# Patient Record
Sex: Female | Born: 1975
Health system: Southern US, Community
[De-identification: ages and names within clinical notes are randomized; demographics above are authoritative.]

## PROBLEM LIST (undated history)

## (undated) DIAGNOSIS — D649 Anemia, unspecified: Secondary | ICD-10-CM

## (undated) DIAGNOSIS — R51 Headache: Secondary | ICD-10-CM

## (undated) DIAGNOSIS — Z800 Family history of malignant neoplasm of digestive organs: Secondary | ICD-10-CM

## (undated) DIAGNOSIS — Z803 Family history of malignant neoplasm of breast: Secondary | ICD-10-CM

## (undated) HISTORY — DX: Family history of malignant neoplasm of breast: Z80.3

## (undated) HISTORY — DX: Family history of malignant neoplasm of digestive organs: Z80.0

---

## 2013-03-24 ENCOUNTER — Other Ambulatory Visit: Payer: Self-pay | Admitting: Family Medicine

## 2013-03-24 DIAGNOSIS — R1011 Right upper quadrant pain: Secondary | ICD-10-CM

## 2013-03-28 ENCOUNTER — Ambulatory Visit
Admission: RE | Admit: 2013-03-28 | Discharge: 2013-03-28 | Disposition: A | Discharge status: Home / Self Care | Payer: BC Managed Care – PPO | Source: Ambulatory Visit | Attending: Family Medicine | Admitting: Family Medicine

## 2013-03-28 DIAGNOSIS — R1011 Right upper quadrant pain: Secondary | ICD-10-CM

## 2013-03-31 ENCOUNTER — Other Ambulatory Visit (HOSPITAL_COMMUNITY): Payer: Self-pay | Admitting: Family Medicine

## 2013-03-31 DIAGNOSIS — R1011 Right upper quadrant pain: Secondary | ICD-10-CM

## 2013-04-12 ENCOUNTER — Ambulatory Visit (HOSPITAL_COMMUNITY)
Admission: RE | Admit: 2013-04-12 | Discharge: 2013-04-12 | Disposition: A | Discharge status: Home / Self Care | Payer: BC Managed Care – PPO | Source: Ambulatory Visit | Attending: Family Medicine | Admitting: Family Medicine

## 2013-04-12 DIAGNOSIS — R1011 Right upper quadrant pain: Secondary | ICD-10-CM

## 2013-04-12 DIAGNOSIS — R109 Unspecified abdominal pain: Secondary | ICD-10-CM | POA: Insufficient documentation

## 2013-04-12 MED ORDER — TECHNETIUM TC 99M MEBROFENIN IV KIT
5.2000 | PACK | Freq: Once | INTRAVENOUS | Status: AC | PRN
  Administered 2013-04-12: 5 via INTRAVENOUS

## 2013-04-12 MED ORDER — SINCALIDE 5 MCG IJ SOLR: 0.0200 ug/kg | Freq: Once | INTRAMUSCULAR | Status: DC

## 2013-04-20 ENCOUNTER — Encounter (INDEPENDENT_AMBULATORY_CARE_PROVIDER_SITE_OTHER): Payer: Self-pay | Admitting: Surgery

## 2013-04-26 ENCOUNTER — Encounter (INDEPENDENT_AMBULATORY_CARE_PROVIDER_SITE_OTHER): Payer: Self-pay | Admitting: Surgery

## 2013-04-26 ENCOUNTER — Ambulatory Visit (INDEPENDENT_AMBULATORY_CARE_PROVIDER_SITE_OTHER): Payer: BC Managed Care – PPO | Admitting: Surgery

## 2013-04-26 ENCOUNTER — Encounter (HOSPITAL_COMMUNITY): Payer: Self-pay | Admitting: Pharmacy Technician

## 2013-04-26 DIAGNOSIS — K811 Chronic cholecystitis: Secondary | ICD-10-CM | POA: Insufficient documentation

## 2013-04-26 NOTE — Progress Notes (Signed)
Patient ID: Misty Strickland, female   DOB: 05/17/1975, 38 y.o.   MRN: [id number]  Chief Complaint  Patient presents with  . New Evaluation    Biliary Dyskinesia    HPI Misty Strickland is a 38 y.o. female.  Referred by Dr. Kathyrn Lass for evaluation of biliary dyskinesia  HPI This is a healthy 38 year old female who is status post 3 cesarean sections. During each of her pregnancies she had intermittent right upper quadrant pain with radiation through to her back. Ultrasound is were performed that showed no sign of gallstones. Over the last 3 months she has developed significant postprandial generalized abdominal pain, bloating, nausea, and diarrhea. These episodes last for 2-3 hours. She had another ultrasound which showed no sign of gallstones or gallbladder wall thickening. A HIDA scan showed a diminished gallbladder ejection fraction at 25.8%. After the scan was complete, she did have a recurrence of her symptoms.    History reviewed. No pertinent past medical history.  Past Surgical History  Procedure Laterality Date  . Cesarean section      2007,2009,2013    Family History  Problem Relation Age of Onset  . Cancer Maternal Grandmother     colon  . Cancer Paternal Grandfather     thyroid cancer    Social History History  Substance Use Topics  . Smoking status: Never Smoker   . Smokeless tobacco: Not on file  . Alcohol Use: 0.0 oz/week    2-3 Glasses of wine per week     Comment: 2-3 a week    No Known Allergies  No current outpatient prescriptions on file.   No current facility-administered medications for this visit.    Review of Systems Review of Systems  Constitutional: Negative for fever, chills and unexpected weight change.  HENT: Negative for congestion, hearing loss, sore throat, trouble swallowing and voice change.   Eyes: Negative for visual disturbance.  Respiratory: Negative for cough and wheezing.   Cardiovascular: Negative for chest pain, palpitations  and leg swelling.  Gastrointestinal: Positive for nausea, abdominal pain, diarrhea and abdominal distention. Negative for vomiting, constipation, blood in stool and anal bleeding.  Genitourinary: Negative for hematuria, vaginal bleeding and difficulty urinating.  Musculoskeletal: Negative for arthralgias.  Skin: Negative for rash and wound.  Neurological: Negative for seizures, syncope and headaches.  Hematological: Negative for adenopathy. Does not bruise/bleed easily.  Psychiatric/Behavioral: Negative for confusion.    Last menstrual period 03/15/2013.  Physical Exam Physical Exam WDWN in NAD HEENT:  EOMI, sclera anicteric Neck:  No masses, no thyromegaly Lungs:  CTA bilaterally; normal respiratory effort CV:  Regular rate and rhythm; no murmurs Abd:  +bowel sounds, soft, non-tender, no masses Ext:  Well-perfused; no edema Skin:  Warm, dry; no sign of jaundice  Data Reviewed Nm Hepato W/eject Fract  04/12/2013   CLINICAL DATA:  Upper abdominal pain  EXAM: NUCLEAR MEDICINE HEPATOBILIARY IMAGING WITH GALLBLADDER EF  Views: Anterior right upper quadrant  Radionuclide:  Technetium 67m  Choletec  Dose:  5.2 mCi  Route of administration: Intravenous  COMPARISON:  None.  FINDINGS: Liver uptake of radiotracer is normal. There is prompt visualization of gallbladder and small bowel, indicating patency of the cystic and common bile ducts.  A weight based dose, 1.2 mcg, of CCK was administered with calculation of the computer generated ejection fraction of radiotracer from the gallbladder. The patient did not experience symptoms with CCK administration. The ejection fraction of radiotracer from the gallbladder is diminished at 25.8%, normal greater than 38%.  IMPRESSION: Abnormally low ejection fraction of radiotracer from the gallbladder, a finding concerning for biliary dyskinesia. Cystic and common bile ducts are patent as is evidenced by visualization of gallbladder and small bowel.    Electronically Signed   By: Lowella Grip M.D.   On: 04/12/2013 15:16   US Abdomen Limited  03/28/2013   CLINICAL DATA:  Upper abdominal pain  EXAM: US ABDOMEN LIMITED - RIGHT UPPER QUADRANT  COMPARISON:  None.  FINDINGS: Gallbladder:  No gallstones or wall thickening visualized. There is no pericholecystic fluid. No sonographic Murphy sign noted.  Common bile duct:  Diameter: 2 mm. There is no intrahepatic or extrahepatic biliary duct dilatation.  Liver:  No focal lesion identified. Within normal limits in parenchymal echogenicity.  IMPRESSION: Study within normal limits.   Electronically Signed   By: Lowella Grip M.D.   On: 03/28/2013 09:05      Assessment    Biliary dyskinesia/ chronic cholecystitis     Plan    Laparoscopic cholecystectomy with intraoperative cholangiogram.  The surgical procedure has been discussed with the patient.  Potential risks, benefits, alternative treatments, and expected outcomes have been explained.  All of the patient's questions at this time have been answered.  The likelihood of reaching the patient's treatment goal is good.  The patient understand the proposed surgical procedure and wishes to proceed.         Draeden Kellman K. 04/26/2013, 12:34 PM

## 2013-04-28 ENCOUNTER — Other Ambulatory Visit (HOSPITAL_COMMUNITY): Payer: Self-pay | Admitting: *Deleted

## 2013-04-28 ENCOUNTER — Encounter (HOSPITAL_COMMUNITY)
Admission: RE | Admit: 2013-04-28 | Discharge: 2013-04-28 | Disposition: A | Discharge status: Home / Self Care | Payer: BC Managed Care – PPO | Source: Ambulatory Visit | Attending: Surgery | Admitting: Surgery

## 2013-04-28 ENCOUNTER — Encounter (HOSPITAL_COMMUNITY): Payer: Self-pay

## 2013-04-28 DIAGNOSIS — Z01812 Encounter for preprocedural laboratory examination: Secondary | ICD-10-CM | POA: Insufficient documentation

## 2013-04-28 HISTORY — DX: Headache: R51

## 2013-04-28 HISTORY — DX: Anemia, unspecified: D64.9

## 2013-04-28 LAB — CBC
HEMATOCRIT: 36.5 % (ref 36.0–46.0)
Hemoglobin: 11.9 g/dL — ABNORMAL LOW (ref 12.0–15.0)
MCH: 27.4 pg (ref 26.0–34.0)
MCHC: 32.6 g/dL (ref 30.0–36.0)
MCV: 83.9 fL (ref 78.0–100.0)
Platelets: 283 10*3/uL (ref 150–400)
RBC: 4.35 MIL/uL (ref 3.87–5.11)
RDW: 13.8 % (ref 11.5–15.5)
WBC: 5.4 10*3/uL (ref 4.0–10.5)

## 2013-04-28 LAB — HCG, SERUM, QUALITATIVE: Preg, Serum: NEGATIVE

## 2013-04-28 MED ORDER — CHLORHEXIDINE GLUCONATE 4 % EX LIQD: 1.0000 "application " | Freq: Once | CUTANEOUS | Status: DC

## 2013-04-28 NOTE — Pre-Procedure Instructions (Signed)
Misty Strickland  04/28/2013   Your procedure is scheduled on:  Wednesday, May 04, 2013 at 8:30 AM.   Report to [facility] Entrance "A" Admitting Office at 6:30 AM.   Call this number if you have problems the morning of surgery: [phone]   Remember:   Do not eat food or drink liquids after midnight Tuesday, 05/03/13.   Take these medicines the morning of surgery with A SIP OF WATER: none   Do not wear jewelry, make-up or nail polish.  Do not wear lotions, powders, or perfumes. You may wear deodorant.  Do not shave 48 hours prior to surgery.   Do not bring valuables to the hospital.  [facility] is not responsible                  for any belongings or valuables.               Contacts, dentures or bridgework may not be worn into surgery.  Leave suitcase in the car. After surgery it may be brought to your room.  For patients admitted to the hospital, discharge time is determined by your                treatment team.               Patients discharged the day of surgery will not be allowed to drive  home.  Name and phone number of your driver: Family/friend   Special Instructions: [facility] - Preparing for Surgery  Before surgery, you can play an important role.  Because skin is not sterile, your skin needs to be as free of germs as possible.  You can reduce the number of germs on you skin by washing with CHG (chlorahexidine gluconate) soap before surgery.  CHG is an antiseptic cleaner which kills germs and bonds with the skin to continue killing germs even after washing.  Please DO NOT use if you have an allergy to CHG or antibacterial soaps.  If your skin becomes reddened/irritated stop using the CHG and inform your nurse when you arrive at Short Stay.  Do not shave (including legs and underarms) for at least 48 hours prior to the first CHG shower.  You may shave your face.  Please follow these instructions carefully:   1.  Shower with CHG Soap the night before  surgery and the                                morning of Surgery.  2.  If you choose to wash your hair, wash your hair first as usual with your       normal shampoo.  3.  After you shampoo, rinse your hair and body thoroughly to remove the                      Shampoo.  4.  Use CHG as you would any other liquid soap.  You can apply chg directly       to the skin and wash gently with scrungie or a clean washcloth.  5.  Apply the CHG Soap to your body ONLY FROM THE NECK DOWN.        Do not use on open wounds or open sores.  Avoid contact with your eyes, ears, mouth and genitals (private parts).  Wash genitals (private parts) with your normal soap.  6.  Wash thoroughly,  paying special attention to the area where your surgery        will be performed.  7.  Thoroughly rinse your body with warm water from the neck down.  8.  DO NOT shower/wash with your normal soap after using and rinsing off       the CHG Soap.  9.  Pat yourself dry with a clean towel.            10.  Wear clean pajamas.            11.  Place clean sheets on your bed the night of your first shower and do not        sleep with pets.  Day of Surgery  Do not apply any lotions the morning of surgery.  Please wear clean clothes to the hospital/surgery center.     Please read over the following fact sheets that you were given: Pain Booklet, Coughing and Deep Breathing and Surgical Site Infection Prevention

## 2013-04-28 NOTE — Addendum Note (Signed)
Addended by: Derl Barrow on: 04/28/2013 07:19 AM   Modules accepted: Orders

## 2013-05-03 MED ORDER — CEFAZOLIN SODIUM-DEXTROSE 2-3 GM-% IV SOLR
2.0000 g | INTRAVENOUS | Status: AC
  Administered 2013-05-04: 2 g via INTRAVENOUS
  Filled 2013-05-03: qty 50

## 2013-05-04 ENCOUNTER — Encounter (HOSPITAL_COMMUNITY)
Admission: RE | Disposition: A | Discharge status: Home / Self Care | Payer: Self-pay | Source: Ambulatory Visit | Attending: Surgery

## 2013-05-04 ENCOUNTER — Ambulatory Visit (HOSPITAL_COMMUNITY): Payer: BC Managed Care – PPO | Admitting: Anesthesiology

## 2013-05-04 ENCOUNTER — Ambulatory Visit (HOSPITAL_COMMUNITY): Payer: BC Managed Care – PPO

## 2013-05-04 ENCOUNTER — Encounter (HOSPITAL_COMMUNITY): Payer: BC Managed Care – PPO | Admitting: Anesthesiology

## 2013-05-04 ENCOUNTER — Encounter (HOSPITAL_COMMUNITY): Payer: Self-pay | Admitting: Anesthesiology

## 2013-05-04 ENCOUNTER — Ambulatory Visit (HOSPITAL_COMMUNITY)
Admission: RE | Admit: 2013-05-04 | Discharge: 2013-05-04 | Disposition: A | Discharge status: Home / Self Care | Payer: BC Managed Care – PPO | Source: Ambulatory Visit | Attending: Surgery | Admitting: Surgery

## 2013-05-04 DIAGNOSIS — K8010 Calculus of gallbladder with chronic cholecystitis without obstruction: Secondary | ICD-10-CM

## 2013-05-04 DIAGNOSIS — I509 Heart failure, unspecified: Secondary | ICD-10-CM | POA: Insufficient documentation

## 2013-05-04 DIAGNOSIS — K811 Chronic cholecystitis: Secondary | ICD-10-CM

## 2013-05-04 DIAGNOSIS — R51 Headache: Secondary | ICD-10-CM | POA: Insufficient documentation

## 2013-05-04 DIAGNOSIS — D649 Anemia, unspecified: Secondary | ICD-10-CM | POA: Insufficient documentation

## 2013-05-04 HISTORY — PX: CHOLECYSTECTOMY: SHX55

## 2013-05-04 SURGERY — LAPAROSCOPIC CHOLECYSTECTOMY WITH INTRAOPERATIVE CHOLANGIOGRAM
Anesthesia: General | Site: Abdomen

## 2013-05-04 MED ORDER — DEXAMETHASONE SODIUM PHOSPHATE 4 MG/ML IJ SOLN
INTRAMUSCULAR | Status: AC
  Filled 2013-05-04: qty 1

## 2013-05-04 MED ORDER — BUPIVACAINE-EPINEPHRINE (PF) 0.25% -1:200000 IJ SOLN
INTRAMUSCULAR | Status: AC
  Filled 2013-05-04: qty 30

## 2013-05-04 MED ORDER — FENTANYL CITRATE 0.05 MG/ML IJ SOLN
INTRAMUSCULAR | Status: AC
  Filled 2013-05-04: qty 5

## 2013-05-04 MED ORDER — SODIUM CHLORIDE 0.9 % IR SOLN
Status: DC | PRN
  Administered 2013-05-04: 1000 mL

## 2013-05-04 MED ORDER — ONDANSETRON HCL 4 MG/2ML IJ SOLN: 4.0000 mg | INTRAMUSCULAR | Status: DC | PRN

## 2013-05-04 MED ORDER — GLYCOPYRROLATE 0.2 MG/ML IJ SOLN
INTRAMUSCULAR | Status: DC | PRN
  Administered 2013-05-04: 0.6 mg via INTRAVENOUS

## 2013-05-04 MED ORDER — NEOSTIGMINE METHYLSULFATE 1 MG/ML IJ SOLN
INTRAMUSCULAR | Status: DC | PRN
  Administered 2013-05-04: 3 mg via INTRAVENOUS

## 2013-05-04 MED ORDER — KETOROLAC TROMETHAMINE 30 MG/ML IJ SOLN: 15.0000 mg | Freq: Once | INTRAMUSCULAR | Status: DC | PRN

## 2013-05-04 MED ORDER — OXYCODONE HCL 5 MG/5ML PO SOLN: 5.0000 mg | Freq: Once | ORAL | Status: DC | PRN

## 2013-05-04 MED ORDER — LACTATED RINGERS IV SOLN
INTRAVENOUS | Status: DC | PRN
  Administered 2013-05-04: 08:00:00 via INTRAVENOUS

## 2013-05-04 MED ORDER — DEXAMETHASONE SODIUM PHOSPHATE 10 MG/ML IJ SOLN
INTRAMUSCULAR | Status: DC | PRN
  Administered 2013-05-04: 4 mg via INTRAVENOUS

## 2013-05-04 MED ORDER — GLYCOPYRROLATE 0.2 MG/ML IJ SOLN
INTRAMUSCULAR | Status: AC
  Filled 2013-05-04: qty 4

## 2013-05-04 MED ORDER — LIDOCAINE HCL (CARDIAC) 20 MG/ML IV SOLN
INTRAVENOUS | Status: DC | PRN
  Administered 2013-05-04: 60 mg via INTRAVENOUS

## 2013-05-04 MED ORDER — PROMETHAZINE HCL 25 MG/ML IJ SOLN: 6.2500 mg | INTRAMUSCULAR | Status: DC | PRN

## 2013-05-04 MED ORDER — OXYCODONE-ACETAMINOPHEN 5-325 MG PO TABS: 1.0000 | ORAL_TABLET | ORAL | Status: DC | PRN

## 2013-05-04 MED ORDER — OXYCODONE HCL 5 MG PO TABS: 5.0000 mg | ORAL_TABLET | Freq: Once | ORAL | Status: DC | PRN

## 2013-05-04 MED ORDER — FENTANYL CITRATE 0.05 MG/ML IJ SOLN
INTRAMUSCULAR | Status: DC | PRN
  Administered 2013-05-04: 150 ug via INTRAVENOUS

## 2013-05-04 MED ORDER — MORPHINE SULFATE 2 MG/ML IJ SOLN: 2.0000 mg | INTRAMUSCULAR | Status: DC | PRN

## 2013-05-04 MED ORDER — FENTANYL CITRATE 0.05 MG/ML IJ SOLN
25.0000 ug | INTRAMUSCULAR | Status: DC | PRN
  Administered 2013-05-04: 25 ug via INTRAVENOUS

## 2013-05-04 MED ORDER — ARTIFICIAL TEARS OP OINT
TOPICAL_OINTMENT | OPHTHALMIC | Status: DC | PRN
  Administered 2013-05-04: 1 via OPHTHALMIC

## 2013-05-04 MED ORDER — ARTIFICIAL TEARS OP OINT
TOPICAL_OINTMENT | OPHTHALMIC | Status: AC
  Filled 2013-05-04: qty 3.5

## 2013-05-04 MED ORDER — MIDAZOLAM HCL 5 MG/5ML IJ SOLN
INTRAMUSCULAR | Status: DC | PRN
  Administered 2013-05-04: 2 mg via INTRAVENOUS

## 2013-05-04 MED ORDER — ONDANSETRON HCL 4 MG/2ML IJ SOLN
INTRAMUSCULAR | Status: DC | PRN
  Administered 2013-05-04: 4 mg via INTRAVENOUS

## 2013-05-04 MED ORDER — ACETAMINOPHEN 325 MG PO TABS: 325.0000 mg | ORAL_TABLET | ORAL | Status: DC | PRN

## 2013-05-04 MED ORDER — LIDOCAINE HCL (CARDIAC) 20 MG/ML IV SOLN
INTRAVENOUS | Status: AC
  Filled 2013-05-04: qty 5

## 2013-05-04 MED ORDER — ONDANSETRON HCL 4 MG/2ML IJ SOLN
INTRAMUSCULAR | Status: AC
  Filled 2013-05-04: qty 2

## 2013-05-04 MED ORDER — 0.9 % SODIUM CHLORIDE (POUR BTL) OPTIME
TOPICAL | Status: DC | PRN
  Administered 2013-05-04: 1000 mL

## 2013-05-04 MED ORDER — PROPOFOL 10 MG/ML IV BOLUS
INTRAVENOUS | Status: DC | PRN
  Administered 2013-05-04: 160 mg via INTRAVENOUS

## 2013-05-04 MED ORDER — NEOSTIGMINE METHYLSULFATE 1 MG/ML IJ SOLN
INTRAMUSCULAR | Status: AC
  Filled 2013-05-04: qty 10

## 2013-05-04 MED ORDER — ONDANSETRON 4 MG PO TBDP: 4.0000 mg | ORAL_TABLET | Freq: Four times a day (QID) | ORAL | Status: DC | PRN

## 2013-05-04 MED ORDER — HYDROMORPHONE HCL PF 1 MG/ML IJ SOLN
INTRAMUSCULAR | Status: AC
  Filled 2013-05-04: qty 1

## 2013-05-04 MED ORDER — MIDAZOLAM HCL 2 MG/2ML IJ SOLN
INTRAMUSCULAR | Status: AC
  Filled 2013-05-04: qty 2

## 2013-05-04 MED ORDER — PHENYLEPHRINE HCL 10 MG/ML IJ SOLN
INTRAMUSCULAR | Status: DC | PRN
  Administered 2013-05-04 (×2): 80 ug via INTRAVENOUS

## 2013-05-04 MED ORDER — PROPOFOL 10 MG/ML IV BOLUS
INTRAVENOUS | Status: AC
  Filled 2013-05-04: qty 20

## 2013-05-04 MED ORDER — ACETAMINOPHEN 160 MG/5ML PO SOLN
325.0000 mg | ORAL | Status: DC | PRN
  Filled 2013-05-04: qty 20.3

## 2013-05-04 MED ORDER — BUPIVACAINE-EPINEPHRINE 0.25% -1:200000 IJ SOLN
INTRAMUSCULAR | Status: DC | PRN
  Administered 2013-05-04: 4 mL

## 2013-05-04 MED ORDER — ROCURONIUM BROMIDE 50 MG/5ML IV SOLN
INTRAVENOUS | Status: AC
  Filled 2013-05-04: qty 1

## 2013-05-04 MED ORDER — ROCURONIUM BROMIDE 100 MG/10ML IV SOLN
INTRAVENOUS | Status: DC | PRN
  Administered 2013-05-04: 40 mg via INTRAVENOUS

## 2013-05-04 MED ORDER — KETOROLAC TROMETHAMINE 30 MG/ML IJ SOLN
INTRAMUSCULAR | Status: DC | PRN
  Administered 2013-05-04: 30 mg via INTRAVENOUS

## 2013-05-04 MED ORDER — SODIUM CHLORIDE 0.9 % IV SOLN
INTRAVENOUS | Status: DC | PRN
  Administered 2013-05-04: 09:00:00

## 2013-05-04 MED ORDER — FENTANYL CITRATE 0.05 MG/ML IJ SOLN
INTRAMUSCULAR | Status: AC
  Filled 2013-05-04: qty 2

## 2013-05-04 SURGICAL SUPPLY — 46 items
APPLIER CLIP ROT 10 11.4 M/L (STAPLE) ×3
BENZOIN TINCTURE PRP APPL 2/3 (GAUZE/BANDAGES/DRESSINGS) ×3 IMPLANT
BLADE SURG ROTATE 9660 (MISCELLANEOUS) IMPLANT
CANISTER SUCTION 2500CC (MISCELLANEOUS) ×3 IMPLANT
CHLORAPREP W/TINT 26ML (MISCELLANEOUS) ×3 IMPLANT
CLIP APPLIE ROT 10 11.4 M/L (STAPLE) ×1 IMPLANT
CLOSURE WOUND 1/2 X4 (GAUZE/BANDAGES/DRESSINGS) ×1
COVER MAYO STAND STRL (DRAPES) ×3 IMPLANT
COVER SURGICAL LIGHT HANDLE (MISCELLANEOUS) ×3 IMPLANT
DERMABOND ADVANCED (GAUZE/BANDAGES/DRESSINGS) ×2
DERMABOND ADVANCED .7 DNX12 (GAUZE/BANDAGES/DRESSINGS) ×1 IMPLANT
DRAPE C-ARM 42X72 X-RAY (DRAPES) ×3 IMPLANT
DRAPE UTILITY 15X26 W/TAPE STR (DRAPE) ×6 IMPLANT
DRSG TEGADERM 2-3/8X2-3/4 SM (GAUZE/BANDAGES/DRESSINGS) ×9 IMPLANT
DRSG TEGADERM 4X4.75 (GAUZE/BANDAGES/DRESSINGS) ×3 IMPLANT
ELECT REM PT RETURN 9FT ADLT (ELECTROSURGICAL) ×3
ELECTRODE REM PT RTRN 9FT ADLT (ELECTROSURGICAL) ×1 IMPLANT
FILTER SMOKE EVAC LAPAROSHD (FILTER) ×3 IMPLANT
GAUZE SPONGE 2X2 8PLY STRL LF (GAUZE/BANDAGES/DRESSINGS) ×1 IMPLANT
GLOVE BIO SURGEON STRL SZ7 (GLOVE) ×3 IMPLANT
GLOVE BIOGEL PI IND STRL 7.0 (GLOVE) ×3 IMPLANT
GLOVE BIOGEL PI IND STRL 7.5 (GLOVE) ×1 IMPLANT
GLOVE BIOGEL PI INDICATOR 7.0 (GLOVE) ×6
GLOVE BIOGEL PI INDICATOR 7.5 (GLOVE) ×2
GLOVE SURG SS PI 7.0 STRL IVOR (GLOVE) ×6 IMPLANT
GOWN STRL REUS W/ TWL LRG LVL3 (GOWN DISPOSABLE) ×4 IMPLANT
GOWN STRL REUS W/TWL LRG LVL3 (GOWN DISPOSABLE) ×8
KIT BASIN OR (CUSTOM PROCEDURE TRAY) ×3 IMPLANT
KIT ROOM TURNOVER OR (KITS) ×3 IMPLANT
NS IRRIG 1000ML POUR BTL (IV SOLUTION) ×3 IMPLANT
PAD ARMBOARD 7.5X6 YLW CONV (MISCELLANEOUS) ×3 IMPLANT
POUCH SPECIMEN RETRIEVAL 10MM (ENDOMECHANICALS) ×3 IMPLANT
SCISSORS LAP 5X35 DISP (ENDOMECHANICALS) ×3 IMPLANT
SET CHOLANGIOGRAPH 5 50 .035 (SET/KITS/TRAYS/PACK) ×3 IMPLANT
SET IRRIG TUBING LAPAROSCOPIC (IRRIGATION / IRRIGATOR) ×3 IMPLANT
SLEEVE ENDOPATH XCEL 5M (ENDOMECHANICALS) ×3 IMPLANT
SPECIMEN JAR SMALL (MISCELLANEOUS) ×3 IMPLANT
SPONGE GAUZE 2X2 STER 10/PKG (GAUZE/BANDAGES/DRESSINGS) ×2
STRIP CLOSURE SKIN 1/2X4 (GAUZE/BANDAGES/DRESSINGS) ×2 IMPLANT
SUT MNCRL AB 4-0 PS2 18 (SUTURE) ×3 IMPLANT
TOWEL OR 17X24 6PK STRL BLUE (TOWEL DISPOSABLE) ×3 IMPLANT
TOWEL OR 17X26 10 PK STRL BLUE (TOWEL DISPOSABLE) ×3 IMPLANT
TRAY LAPAROSCOPIC (CUSTOM PROCEDURE TRAY) ×3 IMPLANT
TROCAR XCEL BLUNT TIP 100MML (ENDOMECHANICALS) ×3 IMPLANT
TROCAR XCEL NON-BLD 11X100MML (ENDOMECHANICALS) ×3 IMPLANT
TROCAR XCEL NON-BLD 5MMX100MML (ENDOMECHANICALS) ×3 IMPLANT

## 2013-05-04 NOTE — Discharge Instructions (Signed)
[facility] °LAPAROSCOPIC SURGERY: POST OP INSTRUCTIONS °Always review your discharge instruction sheet given to you by the facility where your surgery was performed. °IF YOU HAVE DISABILITY OR FAMILY LEAVE FORMS, YOU MUST BRING THEM TO THE OFFICE FOR PROCESSING.   °DO NOT GIVE THEM TO YOUR DOCTOR. ° °1. A prescription for pain medication will be given to you upon discharge.  Take your pain medication as prescribed, if needed.  If narcotic pain medicine is not needed, then you may take acetaminophen (Tylenol) or ibuprofen (Advil) as needed. °2. Take your usually prescribed medications unless otherwise directed. °3. If you need a refill on your pain medication, please contact your pharmacy.  They will contact our office to request authorization. Prescriptions will not be filled after 5pm or on week-ends. °4. You should follow a light diet the first few days after arrival home, such as soup and crackers, etc.  Be sure to include lots of fluids daily. °5. Most patients will experience some swelling and bruising in the area of the incisions.  Ice packs will help.  Swelling and bruising can take several days to resolve.  °6. It is common to experience some constipation if taking pain medication after surgery.  Increasing fluid intake and taking a stool softener (such as Colace) will usually help or prevent this problem from occurring.  A mild laxative (Milk of Magnesia or Miralax) should be taken according to package instructions if there are no bowel movements after 48 hours. °7. Unless discharge instructions indicate otherwise, you may remove your bandages 48 hours after surgery, and you may shower at that time.  You will have steri-strips (small skin tapes) in place directly over the incision.  These strips should be left on the skin for 7-10 days.  If your surgeon used skin glue on the incision, you may shower in 24 hours.  The glue will flake off over the next 2-3 weeks.  Any sutures or staples  will be removed at the office during your follow-up visit. °8. ACTIVITIES:  You may resume regular (light) daily activities beginning the next day--such as daily self-care, walking, climbing stairs--gradually increasing activities as tolerated.  You may have sexual intercourse when it is comfortable.  Refrain from any heavy lifting or straining until approved by your doctor. °a. You may drive when you are no longer taking prescription pain medication, you can comfortably wear a seatbelt, and you can safely maneuver your car and apply brakes. °b. RETURN TO WORK:   2-3 weeks °9. You should see your doctor in the office for a follow-up appointment approximately 2-3 weeks after your surgery.  Make sure that you call for this appointment within a day or two after you arrive home to insure a convenient appointment time. °10. OTHER INSTRUCTIONS: ________________________________________________________________________ °WHEN TO CALL YOUR DOCTOR: °1. Fever over 101.0 °2. Inability to urinate °3. Continued bleeding from incision. °4. Increased pain, redness, or drainage from the incision. °5. Increasing abdominal pain ° °The clinic staff is available to answer your questions during regular business hours.  Please don’t hesitate to call and ask to speak to one of the nurses for clinical concerns.  If you have a medical emergency, go to the nearest emergency room or call 911.  A surgeon from [facility] is always on call at the hospital. °[address], [address], [place], [place]  [address] ? [address] [address], [place], [place]   [address] °[phone] ? [phone] ? FAX [phone] °Web site:   [email or web address] ° °

## 2013-05-04 NOTE — Op Note (Signed)
Laparoscopic Cholecystectomy with IOC Procedure Note  Indications: This patient presents with symptomatic gallbladder disease and will undergo laparoscopic cholecystectomy.  Pre-operative Diagnosis: Biliary dyskinesia  Post-operative Diagnosis: Same  Surgeon: Haidy Kackley K.   Assistants: none  Anesthesia: General endotracheal anesthesia  ASA Class: 1  Procedure Details  The patient was seen again in the Holding Room. The risks, benefits, complications, treatment options, and expected outcomes were discussed with the patient. The possibilities of reaction to medication, pulmonary aspiration, perforation of viscus, bleeding, recurrent infection, finding a normal gallbladder, the need for additional procedures, failure to diagnose a condition, the possible need to convert to an open procedure, and creating a complication requiring transfusion or operation were discussed with the patient. The likelihood of improving the patient's symptoms with return to their baseline status is good.  The patient and/or family concurred with the proposed plan, giving informed consent. The site of surgery properly noted. The patient was taken to Operating Room, identified as Misty Strickland and the procedure verified as Laparoscopic Cholecystectomy with Intraoperative Cholangiogram. A Time Out was held and the above information confirmed.  Prior to the induction of general anesthesia, antibiotic prophylaxis was administered. General endotracheal anesthesia was then administered and tolerated well. After the induction, the abdomen was prepped with Chloraprep and draped in the sterile fashion. The patient was positioned in the supine position.  Local anesthetic agent was injected into the skin below the umbilicus and an incision made. We dissected down to the abdominal fascia with blunt dissection.  The fascia was incised vertically and we entered the peritoneal cavity bluntly.  A pursestring suture of 0-Vicryl was  placed around the fascial opening.  The Hasson cannula was inserted and secured with the stay suture.  Pneumoperitoneum was then created with CO2 and tolerated well without any adverse changes in the patient's vital signs. An 11-mm port was placed in the subxiphoid position.  Two 5-mm ports were placed in the right upper quadrant. All skin incisions were infiltrated with a local anesthetic agent before making the incision and placing the trocars.   We positioned the patient in reverse Trendelenburg, tilted slightly to the patient's left.  The gallbladder was identified, the fundus grasped and retracted cephalad. There were some omental adhesions to the surface of the gallbladder.  Adhesions were lysed bluntly and with the electrocautery where indicated, taking care not to injure any adjacent organs or viscus. The infundibulum was grasped and retracted laterally, exposing the peritoneum overlying the triangle of Calot. This was then divided and exposed in a blunt fashion. A critical view of the cystic duct and cystic artery was obtained.  The cystic duct was clearly identified and bluntly dissected circumferentially. The cystic duct was ligated with a clip distally.   An incision was made in the cystic duct and the [facility] cholangiogram catheter introduced. The catheter was secured using a clip. A cholangiogram was then obtained which showed good visualization of the distal and proximal biliary tree with no sign of filling defects or obstruction.  Contrast flowed easily into the duodenum. The catheter was then removed.   The cystic duct was then ligated with clips and divided. The cystic artery was identified, dissected free, ligated with clips and divided as well.   The gallbladder was dissected from the liver bed in retrograde fashion with the electrocautery. The gallbladder was removed and placed in an Endocatch sac. The liver bed was irrigated and inspected. Hemostasis was achieved with the electrocautery.  Copious irrigation was utilized and was repeatedly  aspirated until clear.  The gallbladder and Endocatch sac were then removed through the umbilical port site.  The pursestring suture was used to close the umbilical fascia.    We again inspected the right upper quadrant for hemostasis.  Pneumoperitoneum was released as we removed the trocars.  4-0 Monocryl was used to close the skin.   Benzoin, steri-strips, and clean dressings were applied. The patient was then extubated and brought to the recovery room in stable condition. Instrument, sponge, and needle counts were correct at closure and at the conclusion of the case.   Findings: Cholecystitis without Cholelithiasis  Estimated Blood Loss: Minimal         Drains: none         Specimens: Gallbladder           Complications: None; patient tolerated the procedure well.         Disposition: PACU - hemodynamically stable.         Condition: stable  Imogene Burn. Georgette Dover, MD, [facility]  General/ Trauma Surgery  05/04/2013 9:43 AM

## 2013-05-04 NOTE — H&P (View-Only) (Signed)
Patient ID: Markeia Harkless, female   DOB: 04/13/1975, 38 y.o.   MRN: [id number]  Chief Complaint  Patient presents with  . New Evaluation    Biliary Dyskinesia    HPI Nanci Lakatos is a 38 y.o. female.  Referred by Dr. Kathyrn Lass for evaluation of biliary dyskinesia  HPI This is a healthy 38 year old female who is status post 3 cesarean sections. During each of her pregnancies she had intermittent right upper quadrant pain with radiation through to her back. Ultrasound is were performed that showed no sign of gallstones. Over the last 3 months she has developed significant postprandial generalized abdominal pain, bloating, nausea, and diarrhea. These episodes last for 2-3 hours. She had another ultrasound which showed no sign of gallstones or gallbladder wall thickening. A HIDA scan showed a diminished gallbladder ejection fraction at 25.8%. After the scan was complete, she did have a recurrence of her symptoms.    History reviewed. No pertinent past medical history.  Past Surgical History  Procedure Laterality Date  . Cesarean section      2007,2009,2013    Family History  Problem Relation Age of Onset  . Cancer Maternal Grandmother     colon  . Cancer Paternal Grandfather     thyroid cancer    Social History History  Substance Use Topics  . Smoking status: Never Smoker   . Smokeless tobacco: Not on file  . Alcohol Use: 0.0 oz/week    2-3 Glasses of wine per week     Comment: 2-3 a week    No Known Allergies  No current outpatient prescriptions on file.   No current facility-administered medications for this visit.    Review of Systems Review of Systems  Constitutional: Negative for fever, chills and unexpected weight change.  HENT: Negative for congestion, hearing loss, sore throat, trouble swallowing and voice change.   Eyes: Negative for visual disturbance.  Respiratory: Negative for cough and wheezing.   Cardiovascular: Negative for chest pain, palpitations  and leg swelling.  Gastrointestinal: Positive for nausea, abdominal pain, diarrhea and abdominal distention. Negative for vomiting, constipation, blood in stool and anal bleeding.  Genitourinary: Negative for hematuria, vaginal bleeding and difficulty urinating.  Musculoskeletal: Negative for arthralgias.  Skin: Negative for rash and wound.  Neurological: Negative for seizures, syncope and headaches.  Hematological: Negative for adenopathy. Does not bruise/bleed easily.  Psychiatric/Behavioral: Negative for confusion.    Last menstrual period 03/15/2013.  Physical Exam Physical Exam WDWN in NAD HEENT:  EOMI, sclera anicteric Neck:  No masses, no thyromegaly Lungs:  CTA bilaterally; normal respiratory effort CV:  Regular rate and rhythm; no murmurs Abd:  +bowel sounds, soft, non-tender, no masses Ext:  Well-perfused; no edema Skin:  Warm, dry; no sign of jaundice  Data Reviewed Nm Hepato W/eject Fract  04/12/2013   CLINICAL DATA:  Upper abdominal pain  EXAM: NUCLEAR MEDICINE HEPATOBILIARY IMAGING WITH GALLBLADDER EF  Views: Anterior right upper quadrant  Radionuclide:  Technetium 36m  Choletec  Dose:  5.2 mCi  Route of administration: Intravenous  COMPARISON:  None.  FINDINGS: Liver uptake of radiotracer is normal. There is prompt visualization of gallbladder and small bowel, indicating patency of the cystic and common bile ducts.  A weight based dose, 1.2 mcg, of CCK was administered with calculation of the computer generated ejection fraction of radiotracer from the gallbladder. The patient did not experience symptoms with CCK administration. The ejection fraction of radiotracer from the gallbladder is diminished at 25.8%, normal greater than 38%.  IMPRESSION: Abnormally low ejection fraction of radiotracer from the gallbladder, a finding concerning for biliary dyskinesia. Cystic and common bile ducts are patent as is evidenced by visualization of gallbladder and small bowel.    Electronically Signed   By: Lowella Grip M.D.   On: 04/12/2013 15:16   US Abdomen Limited  03/28/2013   CLINICAL DATA:  Upper abdominal pain  EXAM: US ABDOMEN LIMITED - RIGHT UPPER QUADRANT  COMPARISON:  None.  FINDINGS: Gallbladder:  No gallstones or wall thickening visualized. There is no pericholecystic fluid. No sonographic Murphy sign noted.  Common bile duct:  Diameter: 2 mm. There is no intrahepatic or extrahepatic biliary duct dilatation.  Liver:  No focal lesion identified. Within normal limits in parenchymal echogenicity.  IMPRESSION: Study within normal limits.   Electronically Signed   By: Lowella Grip M.D.   On: 03/28/2013 09:05      Assessment    Biliary dyskinesia/ chronic cholecystitis     Plan    Laparoscopic cholecystectomy with intraoperative cholangiogram.  The surgical procedure has been discussed with the patient.  Potential risks, benefits, alternative treatments, and expected outcomes have been explained.  All of the patient's questions at this time have been answered.  The likelihood of reaching the patient's treatment goal is good.  The patient understand the proposed surgical procedure and wishes to proceed.         Ladell Lea K. 04/26/2013, 12:34 PM

## 2013-05-04 NOTE — Anesthesia Procedure Notes (Signed)
Procedure Name: Intubation Date/Time: 05/04/2013 8:46 AM Performed by: Neldon Newport Pre-anesthesia Checklist: Patient identified, Timeout performed, Emergency Drugs available, Suction available and Patient being monitored Patient Re-evaluated:Patient Re-evaluated prior to inductionOxygen Delivery Method: Circle system utilized Preoxygenation: Pre-oxygenation with 100% oxygen Intubation Type: IV induction Ventilation: Mask ventilation without difficulty Laryngoscope Size: Mac and 3 Grade View: Grade I Tube type: Oral Tube size: 7.5 mm Number of attempts: 1 Placement Confirmation: ETT inserted through vocal cords under direct vision,  positive ETCO2 and breath sounds checked- equal and bilateral Secured at: 22 cm Tube secured with: Tape Dental Injury: Teeth and Oropharynx as per pre-operative assessment

## 2013-05-04 NOTE — Anesthesia Preprocedure Evaluation (Addendum)
Anesthesia Evaluation  Patient identified by MRN, date of birth, ID band Patient awake    Reviewed: Allergy & Precautions, H&P , NPO status , Patient's Chart, lab work & pertinent test results  History of Anesthesia Complications Negative for: history of anesthetic complications  Airway Mallampati: I TM Distance: >3 FB Neck ROM: Full    Dental  (+) Teeth Intact, Dental Advidsory Given   Pulmonary neg pulmonary ROS,  breath sounds clear to auscultation        Cardiovascular - angina- CHF negative cardio ROS  Rhythm:Regular Rate:Normal     Neuro/Psych  Headaches, negative psych ROS   GI/Hepatic Neg liver ROS, cholecystitis   Endo/Other  negative endocrine ROS  Renal/GU negative Renal ROS     Musculoskeletal negative musculoskeletal ROS (+)   Abdominal   Peds  Hematology  (+) anemia ,   Anesthesia Other Findings   Reproductive/Obstetrics                          Anesthesia Physical Anesthesia Plan  ASA: I  Anesthesia Plan: General   Post-op Pain Management:    Induction: Intravenous  Airway Management Planned: Oral ETT  Additional Equipment: None  Intra-op Plan:   Post-operative Plan: Extubation in OR  Informed Consent:   Dental Advisory Given  Plan Discussed with: Anesthesiologist, CRNA and Surgeon  Anesthesia Plan Comments:        Anesthesia Quick Evaluation

## 2013-05-04 NOTE — Anesthesia Postprocedure Evaluation (Signed)
  Anesthesia Post-op Note  Patient: Misty Strickland  Procedure(s) Performed: Procedure(s): LAPAROSCOPIC CHOLECYSTECTOMY WITH INTRAOPERATIVE CHOLANGIOGRAM (N/A)  Patient Location: PACU  Anesthesia Type:General  Level of Consciousness: awake, alert  and oriented  Airway and Oxygen Therapy: Patient Spontanous Breathing  Post-op Pain: mild  Post-op Assessment: Post-op Vital signs reviewed, Patient's Cardiovascular Status Stable, Respiratory Function Stable, Patent Airway, No signs of Nausea or vomiting and Pain level controlled  Post-op Vital Signs: Reviewed and stable  Complications: No apparent anesthesia complications

## 2013-05-04 NOTE — Preoperative (Signed)
Beta Blockers   Reason not to administer Beta Blockers:Not Applicable 

## 2013-05-04 NOTE — Interval H&P Note (Signed)
History and Physical Interval Note:  05/04/2013 7:21 AM  Misty Strickland  has presented today for surgery, with the diagnosis of Chronic cholecystitis  The various methods of treatment have been discussed with the patient and family. After consideration of risks, benefits and other options for treatment, the patient has consented to  Procedure(s): LAPAROSCOPIC CHOLECYSTECTOMY WITH INTRAOPERATIVE CHOLANGIOGRAM (N/A) as a surgical intervention .  The patient's history has been reviewed, patient examined, no change in status, stable for surgery.  I have reviewed the patient's chart and labs.  Questions were answered to the patient's satisfaction.     Darryle Dennie K.

## 2013-05-04 NOTE — Transfer of Care (Signed)
Immediate Anesthesia Transfer of Care Note  Patient: Misty Strickland  Procedure(s) Performed: Procedure(s): LAPAROSCOPIC CHOLECYSTECTOMY WITH INTRAOPERATIVE CHOLANGIOGRAM (N/A)  Patient Location: PACU  Anesthesia Type:General  Level of Consciousness: awake, alert  and oriented  Airway & Oxygen Therapy: Patient Spontanous Breathing and Patient connected to nasal cannula oxygen  Post-op Assessment: Report given to PACU RN, Post -op Vital signs reviewed and stable and Patient moving all extremities X 4  Post vital signs: Reviewed and stable  Complications: No apparent anesthesia complications

## 2013-05-06 ENCOUNTER — Encounter (HOSPITAL_COMMUNITY): Payer: Self-pay | Admitting: Surgery

## 2013-05-10 ENCOUNTER — Encounter (INDEPENDENT_AMBULATORY_CARE_PROVIDER_SITE_OTHER): Payer: BC Managed Care – PPO | Admitting: Surgery

## 2013-05-30 ENCOUNTER — Ambulatory Visit (INDEPENDENT_AMBULATORY_CARE_PROVIDER_SITE_OTHER): Payer: BC Managed Care – PPO | Admitting: Surgery

## 2013-05-30 ENCOUNTER — Encounter (INDEPENDENT_AMBULATORY_CARE_PROVIDER_SITE_OTHER): Payer: Self-pay | Admitting: Surgery

## 2013-05-30 VITALS — BP 126/72 | HR 75 | Temp 97.8°F | Ht 68.0 in | Wt 133.0 lb

## 2013-05-30 DIAGNOSIS — K811 Chronic cholecystitis: Secondary | ICD-10-CM

## 2013-05-30 NOTE — Progress Notes (Signed)
Status post laparoscopic cholecystectomy with cholangiogram on 05/04/13. The patient states that her bloating is much improved. She is still having some symptoms when she eats quinoa. Almost immediately after eating, she gets cramping and diarrhea. This does not occur with other types of food. She has had french fries with no problems.  Her incisions are well-healed with no sign of infection. No abdominal tenderness.  The patient overall seems to be improved after her cholecystectomy for chronic cholecystitis. However she may have some food sensitivities. If this continues, I encouraged her to consult a GI physician. She may followup with [place] as needed for problems related to her surgery.  Misty Strickland. Georgette Dover, MD, [facility]  General/ Trauma Surgery  05/30/2013 9:54 AM

## 2014-03-28 ENCOUNTER — Other Ambulatory Visit: Payer: Self-pay | Admitting: Dermatology

## 2014-05-16 ENCOUNTER — Other Ambulatory Visit: Payer: Self-pay | Admitting: Dermatology

## 2015-03-28 IMAGING — NM NM HEPATO W/GB/PHARM/[PERSON_NAME]
2 series · 12 of 12 positions shown · non-contrast
Comparison: None.

CLINICAL DATA: Upper abdominal pain

EXAM:
NUCLEAR MEDICINE HEPATOBILIARY IMAGING WITH GALLBLADDER EF
Views: Anterior right upper quadrant
Radionuclide:  Technetium 99m  Choletec
Dose:  5.2 mCi
Route of administration: Intravenous

[Series 1: gb hepatobiliary scan · 4.75mm/px · 6 of 30 frames shown (1 of 2)]
[frame 3/30]
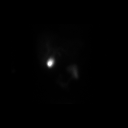
[frame 8/30]
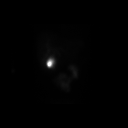
[frame 13/30]
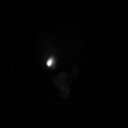
[frame 18/30]
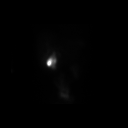
[frame 23/30]
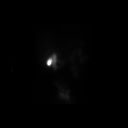
[frame 28/30]
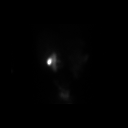

[Series 1: gb hepatobiliary scan · 4.75mm/px · 6 of 60 frames shown (2 of 2)]
[frame 6/60]
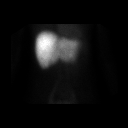
[frame 16/60]
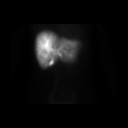
[frame 26/60]
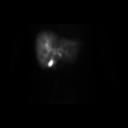
[frame 36/60]
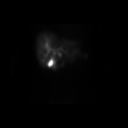
[frame 46/60]
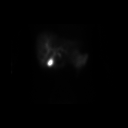
[frame 56/60]
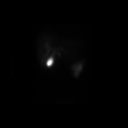

[12 of 12 positions shown; findings below may reference images not displayed]

FINDINGS: Liver uptake of radiotracer is normal. There is prompt visualization
of gallbladder and small bowel, indicating patency of the cystic and
common bile ducts.

A weight based dose, 1.2 mcg, of CCK was administered with
calculation of the computer generated ejection fraction of
radiotracer from the gallbladder. The patient did not experience
symptoms with CCK administration. The ejection fraction of
radiotracer from the gallbladder is diminished at 25.8%, normal
greater than 38%.
IMPRESSION: Abnormally low ejection fraction of radiotracer from the
gallbladder, a finding concerning for biliary dyskinesia. Cystic and
common bile ducts are patent as is evidenced by visualization of
gallbladder and small bowel.

## 2019-08-31 DIAGNOSIS — Z0189 Encounter for other specified special examinations: Secondary | ICD-10-CM | POA: Diagnosis not present

## 2019-09-01 DIAGNOSIS — Z043 Encounter for examination and observation following other accident: Secondary | ICD-10-CM | POA: Diagnosis not present

## 2019-09-01 DIAGNOSIS — Z713 Dietary counseling and surveillance: Secondary | ICD-10-CM | POA: Diagnosis not present

## 2019-12-22 DIAGNOSIS — G43009 Migraine without aura, not intractable, without status migrainosus: Secondary | ICD-10-CM | POA: Diagnosis not present

## 2020-01-20 DIAGNOSIS — Z86018 Personal history of other benign neoplasm: Secondary | ICD-10-CM | POA: Diagnosis not present

## 2020-01-20 DIAGNOSIS — D2230 Melanocytic nevi of unspecified part of face: Secondary | ICD-10-CM | POA: Diagnosis not present

## 2020-01-20 DIAGNOSIS — L570 Actinic keratosis: Secondary | ICD-10-CM | POA: Diagnosis not present

## 2020-01-20 DIAGNOSIS — D2371 Other benign neoplasm of skin of right lower limb, including hip: Secondary | ICD-10-CM | POA: Diagnosis not present

## 2020-01-20 DIAGNOSIS — D2271 Melanocytic nevi of right lower limb, including hip: Secondary | ICD-10-CM | POA: Diagnosis not present

## 2020-04-12 ENCOUNTER — Ambulatory Visit: Payer: BC Managed Care – PPO | Admitting: Nurse Practitioner

## 2020-04-12 ENCOUNTER — Encounter: Payer: Self-pay | Admitting: Nurse Practitioner

## 2020-04-12 ENCOUNTER — Other Ambulatory Visit: Payer: Self-pay

## 2020-04-12 VITALS — BP 118/80 | Ht 67.0 in | Wt 136.0 lb

## 2020-04-12 DIAGNOSIS — Z01419 Encounter for gynecological examination (general) (routine) without abnormal findings: Secondary | ICD-10-CM | POA: Diagnosis not present

## 2020-04-12 DIAGNOSIS — N6325 Unspecified lump in the left breast, overlapping quadrants: Secondary | ICD-10-CM | POA: Diagnosis not present

## 2020-04-12 DIAGNOSIS — Z800 Family history of malignant neoplasm of digestive organs: Secondary | ICD-10-CM

## 2020-04-12 DIAGNOSIS — Z803 Family history of malignant neoplasm of breast: Secondary | ICD-10-CM

## 2020-04-12 DIAGNOSIS — N760 Acute vaginitis: Secondary | ICD-10-CM | POA: Diagnosis not present

## 2020-04-12 DIAGNOSIS — Z13220 Encounter for screening for lipoid disorders: Secondary | ICD-10-CM | POA: Diagnosis not present

## 2020-04-12 LAB — CBC WITH DIFFERENTIAL/PLATELET
Absolute Monocytes: 439 cells/uL (ref 200–950)
Basophils Absolute: 43 cells/uL (ref 0–200)
Basophils Relative: 0.7 %
Eosinophils Absolute: 67 cells/uL (ref 15–500)
Eosinophils Relative: 1.1 %
HCT: 37.8 % (ref 35.0–45.0)
Hemoglobin: 12.6 g/dL (ref 11.7–15.5)
Lymphs Abs: 1482 cells/uL (ref 850–3900)
MCH: 30 pg (ref 27.0–33.0)
MCHC: 33.3 g/dL (ref 32.0–36.0)
MCV: 90 fL (ref 80.0–100.0)
MPV: 11 fL (ref 7.5–12.5)
Monocytes Relative: 7.2 %
Neutro Abs: 4069 cells/uL (ref 1500–7800)
Neutrophils Relative %: 66.7 %
Platelets: 254 10*3/uL (ref 140–400)
RBC: 4.2 10*6/uL (ref 3.80–5.10)
RDW: 13.2 % (ref 11.0–15.0)
Total Lymphocyte: 24.3 %
WBC: 6.1 10*3/uL (ref 3.8–10.8)

## 2020-04-12 LAB — COMPREHENSIVE METABOLIC PANEL
AG Ratio: 1.8 (calc) (ref 1.0–2.5)
ALT: 12 U/L (ref 6–29)
AST: 16 U/L (ref 10–35)
Albumin: 4.2 g/dL (ref 3.6–5.1)
Alkaline phosphatase (APISO): 43 U/L (ref 31–125)
BUN: 9 mg/dL (ref 7–25)
CO2: 27 mmol/L (ref 20–32)
Calcium: 9.2 mg/dL (ref 8.6–10.2)
Chloride: 105 mmol/L (ref 98–110)
Creat: 0.64 mg/dL (ref 0.50–1.10)
Globulin: 2.3 g/dL (calc) (ref 1.9–3.7)
Glucose, Bld: 81 mg/dL (ref 65–99)
Potassium: 4 mmol/L (ref 3.5–5.3)
Sodium: 139 mmol/L (ref 135–146)
Total Bilirubin: 1.2 mg/dL (ref 0.2–1.2)
Total Protein: 6.5 g/dL (ref 6.1–8.1)

## 2020-04-12 LAB — LIPID PANEL
Cholesterol: 193 mg/dL (ref <200)
HDL: 81 mg/dL (ref >=50)
LDL Cholesterol (Calc): 98 mg/dL (calc)
Non-HDL Cholesterol (Calc): 112 mg/dL (calc) (ref <130)
Total CHOL/HDL Ratio: 2.4 (calc) (ref <5.0)
Triglycerides: 54 mg/dL (ref <150)

## 2020-04-12 MED ORDER — HYDROCORTISONE 1 % EX OINT: 1.0000 "application " | TOPICAL_OINTMENT | Freq: Two times a day (BID) | CUTANEOUS | 0 refills | Status: DC

## 2020-04-12 NOTE — Progress Notes (Signed)
Misty Strickland [birth year] [id number]   History:  45 y.o. G3P0003 presents as new patient to establish care without GYN complaints. Monthly cycle/rhythm method for pregnancy prevention. Normal pap and mammogram history. Sister diagnosed with breast cancer at age 46. Grandfather and grandmother with history of colon cancer.   Gynecologic History Patient's last menstrual period was 03/31/2020. Period Pattern: (!) Irregular Menstrual Flow: Moderate Menstrual Control: Maxi pad,Tampon Dysmenorrhea: (!) Mild Dysmenorrhea Symptoms: Cramping Contraception/Family planning: rhythm method  Health Maintenance Last Pap: 2019. Results were: normal Last mammogram: 2021. Results were: normal per patient Last colonoscopy: N/A Last Dexa: N/A  Past medical history, past surgical history, family history and social history were all reviewed and documented in the EPIC chart.  ROS:  A ROS was performed and pertinent positives and negatives are included.  Exam:  Vitals:   04/12/20 1340  BP: 118/80  Weight: 136 lb (61.7 kg)  Height: 5\' 7"  (1.702 m)   Body mass index is 21.3 kg/m.  General appearance:  Normal Thyroid:  Symmetrical, normal in size, without palpable masses or nodularity. Respiratory  Auscultation:  Clear without wheezing or rhonchi Cardiovascular  Auscultation:  Regular rate, without rubs, murmurs or gallops  Edema/varicosities:  Not grossly evident Abdominal  Soft,nontender, without masses, guarding or rebound.  Liver/spleen:  No organomegaly noted  Hernia:  None appreciated  Skin  Inspection:  Grossly normal   Breasts: Examined lying and sitting.   Right: Without masses, retractions, discharge or axillary adenopathy.   Left: Firm, mobile, non-tender lump at 12 o'clock. No retractions, discharge or axillary adenopathy. Gentitourinary   Inguinal/mons:  Normal without inguinal adenopathy  External genitalia:  Generalized redness to external labia  major  BUS/Urethra/Skene's glands:  Normal  Vagina:  Normal  Cervix:  Normal  Uterus:  Anteverted, normal in size, shape and contour.  Midline and mobile  Adnexa/parametria:     Rt: Without masses or tenderness.   Lt: Without masses or tenderness.  Anus and perineum: Normal  Digital rectal exam: Normal sphincter tone without palpated masses or tenderness  Assessment/Plan:  45 y.o. G3P0003 for annual exam.   Well female exam with routine gynecological exam - Plan: CBC with Differential/Platelet, Comprehensive metabolic panel, Lipid panel. Education provided on SBEs, importance of preventative screenings, current guidelines, high calcium diet, regular exercise, and multivitamin daily. Plans to establish with a PCP in the area.   Breast lump on left side at 12 o'clock position - firm, mobile, non-tender lump felt about 1 cm from nipple. We will send referral for left breast ultrasound for further evaluation. She does report history of breast cyst but cannot remember which breast.   Family history of breast cancer in first degree relative - Sister diagnosed age 36. Aunts and grandmothers with breast cancer history. Discussed importance of screening mammograms and discussed genetic testing due to multiple family members with breast cancer. She would like to do this. We will send referral for geneticist.   Acute vaginitis - Plan: hydrocortisone 1 % ointment twice daily x 7-10 days. She reports a history of vaginal irritation that comes and goes. Discussed avoiding soaps/body wash with fragrance and keeping area dry to avoid future irritation.   Family history of colon cancer - grandmother and grandfather with history of colon cancer. Recommend starting screening colonoscopies now at age 35. Information provided on [facility] and she will call to schedule consultation.   Screening for cervical cancer - Normal Pap history.  Pap with HR HPV today.   Follow  up in 1 year for annual.        Tamela Gammon [facility], 1:49 PM 04/12/2020

## 2020-04-12 NOTE — Patient Instructions (Addendum)
Schedule colonoscopy! [facility] [phone] [address] [place], [place] [address]   Health Maintenance, Female Adopting a healthy lifestyle and getting preventive care are important in promoting health and wellness. Ask your health care provider about:  The right schedule for you to have regular tests and exams.  Things you can do on your own to prevent diseases and keep yourself healthy. What should I know about diet, weight, and exercise? Eat a healthy diet  Eat a diet that includes plenty of vegetables, fruits, low-fat dairy products, and lean protein.  Do not eat a lot of foods that are high in solid fats, added sugars, or sodium.   Maintain a healthy weight Body mass index (BMI) is used to identify weight problems. It estimates body fat based on height and weight. Your health care provider can help determine your BMI and help you achieve or maintain a healthy weight. Get regular exercise Get regular exercise. This is one of the most important things you can do for your health. Most adults should:  Exercise for at least 150 minutes each week. The exercise should increase your heart rate and make you sweat (moderate-intensity exercise).  Do strengthening exercises at least twice a week. This is in addition to the moderate-intensity exercise.  Spend less time sitting. Even light physical activity can be beneficial. Watch cholesterol and blood lipids Have your blood tested for lipids and cholesterol at 46 years of age, then have this test every 5 years. Have your cholesterol levels checked more often if:  Your lipid or cholesterol levels are high.  You are older than 46 years of age.  You are at high risk for heart disease. What should I know about cancer screening? Depending on your health history and family history, you may need to have cancer screening at various ages. This may include screening for:  Breast cancer.  Cervical cancer.  Colorectal cancer.  Skin  cancer.  Lung cancer. What should I know about heart disease, diabetes, and high blood pressure? Blood pressure and heart disease  High blood pressure causes heart disease and increases the risk of stroke. This is more likely to develop in people who have high blood pressure readings, are of African descent, or are overweight.  Have your blood pressure checked: ? Every 3-5 years if you are 18-39 years of age. ? Every year if you are 40 years old or older. Diabetes Have regular diabetes screenings. This checks your fasting blood sugar level. Have the screening done:  Once every three years after age 40 if you are at a normal weight and have a low risk for diabetes.  More often and at a younger age if you are overweight or have a high risk for diabetes. What should I know about preventing infection? Hepatitis B If you have a higher risk for hepatitis B, you should be screened for this virus. Talk with your health care provider to find out if you are at risk for hepatitis B infection. Hepatitis C Testing is recommended for:  Everyone born from 1945 through 1965.  Anyone with known risk factors for hepatitis C. Sexually transmitted infections (STIs)  Get screened for STIs, including gonorrhea and chlamydia, if: ? You are sexually active and are younger than 46 years of age. ? You are older than 46 years of age and your health care provider tells you that you are at risk for this type of infection. ? Your sexual activity has changed since you were last screened,   you are at increased risk for chlamydia or gonorrhea. Ask your health care provider if you are at risk.  Ask your health care provider about whether you are at high risk for HIV. Your health care provider may recommend a prescription medicine to help prevent HIV infection. If you choose to take medicine to prevent HIV, you should first get tested for HIV. You should then be tested every 3 months for as long as you are taking  the medicine. Pregnancy  If you are about to stop having your period (premenopausal) and you may become pregnant, seek counseling before you get pregnant.  Take 400 to 800 micrograms (mcg) of folic acid every day if you become pregnant.  Ask for birth control (contraception) if you want to prevent pregnancy. Osteoporosis and menopause Osteoporosis is a disease in which the bones lose minerals and strength with aging. This can result in bone fractures. If you are 58 years old or older, or if you are at risk for osteoporosis and fractures, ask your health care provider if you should:  Be screened for bone loss.  Take a calcium or vitamin D supplement to lower your risk of fractures.  Be given hormone replacement therapy (HRT) to treat symptoms of menopause. Follow these instructions at home: Lifestyle  Do not use any products that contain nicotine or tobacco, such as cigarettes, e-cigarettes, and chewing tobacco. If you need help quitting, ask your health care provider.  Do not use street drugs.  Do not share needles.  Ask your health care provider for help if you need support or information about quitting drugs. Alcohol use  Do not drink alcohol if: ? Your health care provider tells you not to drink. ? You are pregnant, may be pregnant, or are planning to become pregnant.  If you drink alcohol: ? Limit how much you use to 0-1 drink a day. ? Limit intake if you are breastfeeding.  Be aware of how much alcohol is in your drink. In the [place] one drink equals one 12 oz bottle of beer (355 mL), one 5 oz glass of wine (148 mL), or one 1 oz glass of hard liquor (44 mL). General instructions  Schedule regular health, dental, and eye exams.  Stay current with your vaccines.  Tell your health care provider if: ? You often feel depressed. ? You have ever been abused or do not feel safe at home. Summary  Adopting a healthy lifestyle and getting preventive care are important in  promoting health and wellness.  Follow your health care provider's instructions about healthy diet, exercising, and getting tested or screened for diseases.  Follow your health care provider's instructions on monitoring your cholesterol and blood pressure. This information is not intended to replace advice given to you by your health care provider. Make sure you discuss any questions you have with your health care provider. Document Revised: 01/20/2018 Document Reviewed: 01/20/2018 Elsevier Patient Education  2021 [employer].

## 2020-04-13 ENCOUNTER — Telehealth: Payer: Self-pay | Admitting: Genetic Counselor

## 2020-04-13 ENCOUNTER — Telehealth: Payer: Self-pay | Admitting: *Deleted

## 2020-04-13 DIAGNOSIS — Z800 Family history of malignant neoplasm of digestive organs: Secondary | ICD-10-CM

## 2020-04-13 DIAGNOSIS — N6325 Unspecified lump in the left breast, overlapping quadrants: Secondary | ICD-10-CM

## 2020-04-13 DIAGNOSIS — Z803 Family history of malignant neoplasm of breast: Secondary | ICD-10-CM

## 2020-04-13 NOTE — Telephone Encounter (Signed)
-----   Message from Tamela Gammon, NP sent at 04/12/2020  4:05 PM EST ----- Please send referral for genetic counseling due to significant family history of breast cancer.   She also needs referral for left breast ultrasound for lump at 12 o'clock. She is also due for screening mammogram.  Thank you!

## 2020-04-13 NOTE — Telephone Encounter (Signed)
Scheduled appointments per 3/4 referral. Spoke to patient who is aware of appointments date and times.

## 2020-04-13 NOTE — Telephone Encounter (Addendum)
1. Referral placed at Genetic counseling at [facility] they will call to schedule. Patient now scheduled on 04/25/20 with Genetic counseling.  2. Appointment scheduled at the breast center for imaging on 05/25/20 @ 1:40pm aware she can call daily/weekly to check for cancellations.

## 2020-04-14 LAB — PAP, TP IMAGING W/ HPV RNA, RFLX HPV TYPE 16,18/45: HPV DNA High Risk: NOT DETECTED

## 2020-04-17 DIAGNOSIS — L821 Other seborrheic keratosis: Secondary | ICD-10-CM | POA: Diagnosis not present

## 2020-04-19 ENCOUNTER — Encounter: Payer: Self-pay | Admitting: Genetic Counselor

## 2020-04-19 ENCOUNTER — Telehealth: Payer: Self-pay

## 2020-04-19 NOTE — Telephone Encounter (Addendum)
Patient was in office with lump in breast.  She said she is scheduled for the first available appointment at [facility] on 06/04/20.  She said she isanxious and would like to get the test soon.  She asked if you would recommend somewhere else for her to go and be tested. She said she will even drive to another city if needed.  I called the [facility] hoping for a cancellation but this is first available at this time.  I called [facility] and their first available is 05/22/20.

## 2020-04-19 NOTE — Telephone Encounter (Signed)
Patient informed of all this and that we can only recommend our two local facilities.. The facility has told me that they have cancellations daily and  I encouraged her to call regularly/daily and check for cancellation.  She agrees.

## 2020-04-19 NOTE — Telephone Encounter (Signed)
I am not sure of another recommendation. I know they do them at some of the hospitals. Maybe [facility] would know?

## 2020-04-25 ENCOUNTER — Encounter: Payer: Self-pay | Admitting: Genetic Counselor

## 2020-04-25 ENCOUNTER — Inpatient Hospital Stay: Payer: BC Managed Care – PPO

## 2020-04-25 ENCOUNTER — Inpatient Hospital Stay: Payer: BC Managed Care – PPO | Attending: Genetic Counselor | Admitting: Genetic Counselor

## 2020-04-25 ENCOUNTER — Other Ambulatory Visit: Payer: Self-pay

## 2020-04-25 DIAGNOSIS — Z800 Family history of malignant neoplasm of digestive organs: Secondary | ICD-10-CM | POA: Insufficient documentation

## 2020-04-25 DIAGNOSIS — Z803 Family history of malignant neoplasm of breast: Secondary | ICD-10-CM

## 2020-04-25 LAB — GENETIC SCREENING ORDER

## 2020-04-25 NOTE — Progress Notes (Signed)
REFERRING PROVIDER: Tamela Gammon, NP [address] [place],  [place] [address]  PRIMARY PROVIDER:  Kathyrn Lass, MD  PRIMARY REASON FOR VISIT:  1. Family history of breast cancer   2. Family history of pancreatic cancer      HISTORY OF PRESENT ILLNESS:   Misty Strickland, a 45 y.o. female, was seen for a [facility] cancer genetics consultation at the request of Dr. Juleen China due to a family history of breast and pancreatic cancer.  Misty Strickland presents to clinic today to discuss the possibility of a hereditary predisposition to cancer, genetic testing, and to further clarify her future cancer risks, as well as potential cancer risks for family members.   Misty Strickland is a 45 y.o. female with no personal history of cancer.    CANCER HISTORY:  Oncology History   No history exists.     RISK FACTORS:  Menarche was at age 35.  First live birth at age 73.  OCP use for approximately 1 years.  Ovaries intact: yes.  Hysterectomy: no.  Menopausal status: premenopausal.  HRT use: 0 years. Colonoscopy: no; not examined. Mammogram within the last year: yes. Number of breast biopsies: 0. Up to date with pelvic exams: yes. Any excessive radiation exposure in the past: no  Past Medical History:  Diagnosis Date  . Anemia    during pregnancy  . Family history of breast cancer   . Family history of pancreatic cancer   . Headache(784.0)    during menstrual periods    Past Surgical History:  Procedure Laterality Date  . CESAREAN SECTION     2007,2009,2013  . CHOLECYSTECTOMY N/A 05/04/2013   Procedure: LAPAROSCOPIC CHOLECYSTECTOMY WITH INTRAOPERATIVE CHOLANGIOGRAM;  Surgeon: Imogene Burn. Georgette Dover, MD;  Location: [facility];  Service: General;  Laterality: N/A;    Social History   Socioeconomic History  . Marital status: Married    Spouse name: Not on file  . Number of children: Not on file  . Years of education: Not on file  . Highest education level: Not on file  Occupational  History  . Not on file  Tobacco Use  . Smoking status: Never Smoker  . Smokeless tobacco: Never Used  Substance and Sexual Activity  . Alcohol use: Yes    Alcohol/week: 2.0 - 3.0 standard drinks    Types: 2 - 3 Glasses of wine per week    Comment: 2-3 a week  . Drug use: No  . Sexual activity: Yes  Other Topics Concern  . Not on file  Social History Narrative  . Not on file   Social Determinants of Health   Financial Resource Strain: Not on file  Food Insecurity: Not on file  Transportation Needs: Not on file  Physical Activity: Not on file  Stress: Not on file  Social Connections: Not on file     FAMILY HISTORY:  We obtained a detailed, 4-generation family history.  Significant diagnoses are listed below: Family History  Problem Relation Age of Onset  . Cancer Maternal Grandmother        colon  . Cancer Paternal Grandfather        thyroid cancer  . Mitral valve prolapse Mother   . Breast cancer Sister 59  . Breast cancer Paternal Grandmother        dx in her 22s  . Breast cancer Cousin 48       pat first cousin with breast cancer  . Pancreatic cancer Cousin  pat first cousin dx in his 17s    The patient has two daughters and a son who are cancer free.  She has two sisters.  One sister had breast cancer at 35 and is reportedly negative for BRCA mutations.  Both parents are living.  The patient's mother had three sisters and a brother who are cancer free.  Her parents are deceased. The grandmother had colon cancer in her 32's.  The patient's father had two sisters and two brothers.  One brother has a daughter who had breast cancer in her 78's.  A sister had a son who died of pancreatic cancer in his 52's.  The paternal grandparents are deceased. The grandmother had breast cancer in her 23's and the grandfather had thyroid and colon cancer.  Misty Strickland is aware of previous family history of genetic testing for hereditary cancer risks. Patient's maternal  ancestors are of [place] and [place] descent, and paternal ancestors are of [place] and [place] descent. There is no reported Ashkenazi Jewish ancestry. There is no known consanguinity.  GENETIC COUNSELING ASSESSMENT: Misty Strickland is a 45 y.o. female with a family history of cancer which is somewhat suggestive of a hereditary cancer syndrome and predisposition to cancer given the combination of cancers in the family and the early ages of onset. We, therefore, discussed and recommended the following at today's visit.   DISCUSSION: We discussed that 5 - 10% of breast cancer is hereditary, with most cases associated with BRCA mutations.  There are other genes that can be associated with hereditary breast cancer syndromes.  These include ATM, CHEK2 and PALB2.  We discussed that testing is beneficial for several reasons including knowing how to follow individuals and understand if other family members could be at risk for cancer and allow them to undergo genetic testing.   We reviewed the characteristics, features and inheritance patterns of hereditary cancer syndromes. We also discussed genetic testing, including the appropriate family members to test, the process of testing, insurance coverage and turn-around-time for results. We discussed the implications of a negative, positive, carrier and/or variant of uncertain significant result. We recommended Misty Strickland pursue genetic testing for the Common hereditary cancer gene panel. The Common Hereditary Gene Panel +RNA offered by [facility] includes sequencing and/or deletion duplication testing of the following 47 genes: APC, ATM, AXIN2, BARD1, BMPR1A, BRCA1, BRCA2, BRIP1, CDH1, CDK4, CDKN2A (p14ARF), CDKN2A (p16INK4a), CHEK2, CTNNA1, DICER1, EPCAM (Deletion/duplication testing only), GREM1 (promoter region deletion/duplication testing only), KIT, MEN1, MLH1, MSH2, MSH3, MSH6, MUTYH, NBN, NF1, NHTL1, PALB2, PDGFRA, PMS2, POLD1, POLE, PTEN, RAD50, RAD51C, RAD51D, SDHB, SDHC,  SDHD, SMAD4, SMARCA4. STK11, TP53, TSC1, TSC2, and VHL.  The following genes were evaluated for sequence changes only: SDHA and HOXB13 c.251G>A variant only.   Based on Misty Strickland's family history of cancer, she meets medical criteria for genetic testing. Despite that she meets criteria, she may still have an out of pocket cost. We discussed that if her out of pocket cost for testing is over $100, the laboratory will call and confirm whether she wants to proceed with testing.  If the out of pocket cost of testing is less than $100 she will be billed by the genetic testing laboratory.   PLAN: After considering the risks, benefits, and limitations, Misty Strickland provided informed consent to pursue genetic testing and the blood sample was sent to [facility] for analysis of the common hereditary cancer panel. Results should be available within approximately 2-3 weeks' time, at which point they will  be disclosed by telephone to Misty Strickland, as will any additional recommendations warranted by these results. Misty Strickland will receive a summary of her genetic counseling visit and a copy of her results once available. This information will also be available in Epic.   Lastly, we encouraged Misty Strickland to remain in contact with cancer genetics annually so that we can continuously update the family history and inform her of any changes in cancer genetics and testing that may be of benefit for this family.   Misty Strickland questions were answered to her satisfaction today. Our contact information was provided should additional questions or concerns arise. Thank you for the referral and allowing [place] to share in the care of your patient.   Misty Strickland P. Florene Glen, [place], [facility] Licensed, Insurance risk surveyor [email or web address] phone: [phone]  The patient was seen for a total of 45 minutes in face-to-face genetic counseling.  This patient was discussed with Drs. Magrinat, Lindi Adie and/or Burr Medico who agrees with  the above.    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

## 2020-05-14 ENCOUNTER — Encounter: Payer: Self-pay | Admitting: Genetic Counselor

## 2020-05-14 ENCOUNTER — Ambulatory Visit
Admission: RE | Admit: 2020-05-14 | Discharge: 2020-05-14 | Disposition: A | Discharge status: Home / Self Care | Payer: BC Managed Care – PPO | Source: Ambulatory Visit | Attending: Nurse Practitioner | Admitting: Nurse Practitioner

## 2020-05-14 ENCOUNTER — Telehealth: Payer: Self-pay | Admitting: Genetic Counselor

## 2020-05-14 ENCOUNTER — Other Ambulatory Visit: Payer: Self-pay

## 2020-05-14 ENCOUNTER — Ambulatory Visit: Payer: Self-pay | Admitting: Genetic Counselor

## 2020-05-14 DIAGNOSIS — Z803 Family history of malignant neoplasm of breast: Secondary | ICD-10-CM | POA: Diagnosis not present

## 2020-05-14 DIAGNOSIS — N6325 Unspecified lump in the left breast, overlapping quadrants: Secondary | ICD-10-CM

## 2020-05-14 DIAGNOSIS — Z1379 Encounter for other screening for genetic and chromosomal anomalies: Secondary | ICD-10-CM

## 2020-05-14 NOTE — Telephone Encounter (Signed)
Revealed negative genetic testing.  Discussed that we do not know why she has cancer in the family. It could be due to a different gene that we are not testing, or maybe our current technology may not be able to pick something up.  It will be important for her to keep in contact with genetics to keep up with whether additional testing may be needed.

## 2020-05-14 NOTE — Progress Notes (Signed)
HPI:  Misty Strickland was previously seen in the [facility] due to a personal and family history of breast cancer and concerns regarding a hereditary predisposition to cancer. Please refer to our prior cancer genetics clinic note for more information regarding our discussion, assessment and recommendations, at the time. Misty Strickland recent genetic test results were disclosed to her, as were recommendations warranted by these results. These results and recommendations are discussed in more detail below.  CANCER HISTORY:  Oncology History   No history exists.    FAMILY HISTORY:  We obtained a detailed, 4-generation family history.  Significant diagnoses are listed below: Family History  Problem Relation Age of Onset  . Cancer Maternal Grandmother        colon  . Cancer Paternal Grandfather        thyroid cancer  . Mitral valve prolapse Mother   . Breast cancer Sister 73  . Breast cancer Paternal Grandmother        dx in her 30s  . Breast cancer Cousin 9       pat first cousin with breast cancer  . Pancreatic cancer Cousin        pat first cousin dx in his 55s    The patient has two daughters and a son who are cancer free.  She has two sisters.  One sister had breast cancer at 87 and is reportedly negative for BRCA mutations.  Both parents are living.  The patient's mother had three sisters and a brother who are cancer free.  Her parents are deceased. The grandmother had colon cancer in her 73's.  The patient's father had two sisters and two brothers.  One brother has a daughter who had breast cancer in her 63's.  A sister had a son who died of pancreatic cancer in his 28's.  The paternal grandparents are deceased. The grandmother had breast cancer in her 79's and the grandfather had thyroid and colon cancer.  Misty Strickland is aware of previous family history of genetic testing for hereditary cancer risks. Patient's maternal ancestors are of [place] and [place]  descent, and paternal ancestors are of [place] and [place] descent. There is no reported Ashkenazi Jewish ancestry. There is no known consanguinity.  GENETIC TEST RESULTS: Genetic testing reported out on May 12, 2020 through the common hereditary cancer panel found no pathogenic mutations. The Common Hereditary Gene Panel offered by [facility] includes sequencing and/or deletion duplication testing of the following 47 genes: APC, ATM, AXIN2, BARD1, BMPR1A, BRCA1, BRCA2, BRIP1, CDH1, CDK4, CDKN2A (p14ARF), CDKN2A (p16INK4a), CHEK2, CTNNA1, DICER1, EPCAM (Deletion/duplication testing only), GREM1 (promoter region deletion/duplication testing only), KIT, MEN1, MLH1, MSH2, MSH3, MSH6, MUTYH, NBN, NF1, NHTL1, PALB2, PDGFRA, PMS2, POLD1, POLE, PTEN, RAD50, RAD51C, RAD51D, SDHB, SDHC, SDHD, SMAD4, SMARCA4. STK11, TP53, TSC1, TSC2, and VHL.  The following genes were evaluated for sequence changes only: SDHA and HOXB13 c.251G>A variant only. The test report has been scanned into EPIC and is located under the Molecular Pathology section of the Results Review tab.  A portion of the result report is included below for reference.     We discussed with Misty Strickland that because current genetic testing is not perfect, it is possible there may be a gene mutation in one of these genes that current testing cannot detect, but that chance is small.  We also discussed, that there could be another gene that has not yet been discovered, or that we have not yet tested, that is responsible for  the cancer diagnoses in the family. It is also possible there is a hereditary cause for the cancer in the family that Misty Strickland did not inherit and therefore was not identified in her testing.  Therefore, it is important to remain in touch with cancer genetics in the future so that we can continue to offer Misty Strickland the most up to date genetic testing.   ADDITIONAL GENETIC TESTING: We discussed with Misty Strickland that there are other genes that are  associated with increased cancer risk that can be analyzed. Should Misty Strickland wish to pursue additional genetic testing, we are happy to discuss and coordinate this testing, at any time.    CANCER SCREENING RECOMMENDATIONS: Misty Strickland test result is considered negative (normal).  This means that we have not identified a hereditary cause for her personal and family history of cancer at this time. Most cancers happen by chance and this negative test suggests that her cancer may fall into this category.    While reassuring, this does not definitively rule out a hereditary predisposition to cancer. It is still possible that there could be genetic mutations that are undetectable by current technology. There could be genetic mutations in genes that have not been tested or identified to increase cancer risk.  Therefore, it is recommended she continue to follow the cancer management and screening guidelines provided by her primary healthcare provider.   An individual's cancer risk and medical management are not determined by genetic test results alone. Overall cancer risk assessment incorporates additional factors, including personal medical history, family history, and any available genetic information that may result in a personalized plan for cancer prevention and surveillance  Based on Misty Strickland's personal and family of cancer, as well as her genetic test results, statistical models (Tyrer Cusik)  and literature data were used to estimate her risk of developing breast cancer. These estimate her lifetime risk of developing breast cancer to be approximately 27.7%.  The patient's lifetime breast cancer risk is a preliminary estimate based on available information using one of several models endorsed by the [employer] (ACS). The ACS recommends consideration of breast MRI screening as an adjunct to mammography for patients at high risk (defined as 20% or greater lifetime risk). A more detailed breast  cancer risk assessment can be considered, if clinically indicated.   Misty Strickland has been determined to be at high risk for breast cancer.  Therefore, we recommend that annual screening with mammography and breast MRI begin at age 63, or 10 years prior to the age of breast cancer diagnosis in a relative (whichever is earlier).  We, therefore, discussed that it is reasonable for Misty Strickland to be followed by a high-risk breast cancer clinic; in addition to a yearly mammogram and physical exam by a healthcare provider, she should discuss the usefulness of an annual breast MRI with the high-risk clinic providers.    RECOMMENDATIONS FOR FAMILY MEMBERS:  Individuals in this family might be at some increased risk of developing cancer, over the general population risk, simply due to the family history of cancer.  We recommended women in this family have a yearly mammogram beginning at age 19, or 3 years younger than the earliest onset of cancer, an annual clinical breast exam, and perform monthly breast self-exams. Women in this family should also have a gynecological exam as recommended by their primary provider. All family members should be referred for colonoscopy starting at age 79.  FOLLOW-UP: Lastly, we discussed with Ms.  Strickland that cancer genetics is a rapidly advancing field and it is possible that new genetic tests will be appropriate for her and/or her family members in the future. We encouraged her to remain in contact with cancer genetics on an annual basis so we can update her personal and family histories and let her know of advances in cancer genetics that may benefit this family.   Our contact number was provided. Misty Strickland questions were answered to her satisfaction, and she knows she is welcome to call us at anytime with additional questions or concerns.   Roma Kayser, [place], [facility] Licensed, Certified Genetic Counselor [email or web address]

## 2020-05-14 NOTE — Telephone Encounter (Signed)
LM on VM that results are back and to please call. 

## 2020-05-25 ENCOUNTER — Other Ambulatory Visit: Payer: Self-pay

## 2020-06-04 ENCOUNTER — Other Ambulatory Visit: Payer: Self-pay

## 2020-12-13 DIAGNOSIS — Z0189 Encounter for other specified special examinations: Secondary | ICD-10-CM | POA: Diagnosis not present

## 2021-01-23 DIAGNOSIS — D2239 Melanocytic nevi of other parts of face: Secondary | ICD-10-CM | POA: Diagnosis not present

## 2021-01-23 DIAGNOSIS — L578 Other skin changes due to chronic exposure to nonionizing radiation: Secondary | ICD-10-CM | POA: Diagnosis not present

## 2021-01-23 DIAGNOSIS — D485 Neoplasm of uncertain behavior of skin: Secondary | ICD-10-CM | POA: Diagnosis not present

## 2021-01-23 DIAGNOSIS — L700 Acne vulgaris: Secondary | ICD-10-CM | POA: Diagnosis not present

## 2021-01-23 DIAGNOSIS — D2230 Melanocytic nevi of unspecified part of face: Secondary | ICD-10-CM | POA: Diagnosis not present

## 2021-01-23 DIAGNOSIS — L710 Perioral dermatitis: Secondary | ICD-10-CM | POA: Diagnosis not present

## 2021-02-22 ENCOUNTER — Encounter: Payer: Self-pay | Admitting: Internal Medicine

## 2021-03-18 ENCOUNTER — Other Ambulatory Visit: Payer: Self-pay

## 2021-03-18 ENCOUNTER — Ambulatory Visit (AMBULATORY_SURGERY_CENTER): Payer: BC Managed Care – PPO | Admitting: *Deleted

## 2021-03-18 VITALS — Ht 67.0 in | Wt 135.0 lb

## 2021-03-18 DIAGNOSIS — Z1211 Encounter for screening for malignant neoplasm of colon: Secondary | ICD-10-CM

## 2021-03-18 MED ORDER — NA SULFATE-K SULFATE-MG SULF 17.5-3.13-1.6 GM/177ML PO SOLN: 1.0000 | ORAL | 0 refills | Status: DC

## 2021-03-18 NOTE — Progress Notes (Signed)

## 2021-04-03 ENCOUNTER — Encounter: Payer: Self-pay | Admitting: Internal Medicine

## 2021-04-05 ENCOUNTER — Ambulatory Visit (AMBULATORY_SURGERY_CENTER): Payer: BC Managed Care – PPO | Admitting: Internal Medicine

## 2021-04-05 ENCOUNTER — Encounter: Payer: Self-pay | Admitting: Internal Medicine

## 2021-04-05 VITALS — BP 82/52 | HR 83 | Temp 98.4°F | Resp 12 | Ht 67.0 in | Wt 135.0 lb

## 2021-04-05 DIAGNOSIS — Z1211 Encounter for screening for malignant neoplasm of colon: Secondary | ICD-10-CM | POA: Diagnosis not present

## 2021-04-05 DIAGNOSIS — D122 Benign neoplasm of ascending colon: Secondary | ICD-10-CM

## 2021-04-05 DIAGNOSIS — K635 Polyp of colon: Secondary | ICD-10-CM | POA: Diagnosis not present

## 2021-04-05 MED ORDER — SODIUM CHLORIDE 0.9 % IV SOLN: 500.0000 mL | Freq: Once | INTRAVENOUS | Status: DC

## 2021-04-05 NOTE — Progress Notes (Signed)
GASTROENTEROLOGY PROCEDURE H&P NOTE   Primary Care Physician: Pcp, No    Reason for Procedure:   Colon cancer screening  Plan:    Colonoscopy  Patient is appropriate for endoscopic procedure(s) in the ambulatory ([place]) setting.  The nature of the procedure, as well as the risks, benefits, and alternatives were carefully and thoroughly reviewed with the patient. Ample time for discussion and questions allowed. The patient understood, was satisfied, and agreed to proceed.     HPI: Misty Strickland is a 46 y.o. female who presents for colonoscopy for colon cancer screening. Denies blood in stools, changes in bowel habits, weight loss. She thinks that she may have IBS. Fam hx positive for colon cancer in grandmother and grandfather  Past Medical History:  Diagnosis Date   Anemia    during pregnancy   Family history of breast cancer    Family history of pancreatic cancer    Headache(784.0)    during menstrual periods    Past Surgical History:  Procedure Laterality Date   CESAREAN SECTION     2007,2009,2013   CHOLECYSTECTOMY N/A 05/04/2013   Procedure: LAPAROSCOPIC CHOLECYSTECTOMY WITH INTRAOPERATIVE CHOLANGIOGRAM;  Surgeon: Imogene Burn. Georgette Dover, MD;  Location: [facility];  Service: General;  Laterality: N/A;    Prior to Admission medications   Medication Sig Start Date End Date Taking? Authorizing Provider  Multiple Vitamin (MULTIVITAMIN) LIQD Take 5 mLs by mouth daily.   Yes [provider]  SUMAtriptan (IMITREX) 50 MG tablet Take by mouth. 12/14/19   [provider]    Current Outpatient Medications  Medication Sig Dispense Refill   Multiple Vitamin (MULTIVITAMIN) LIQD Take 5 mLs by mouth daily.     SUMAtriptan (IMITREX) 50 MG tablet Take by mouth.     Current Facility-Administered Medications  Medication Dose Route Frequency Provider Last Rate Last Admin   0.9 %  sodium chloride infusion  500 mL Intravenous Once Sharyn Creamer, MD        Allergies as of  04/05/2021   (No Known Allergies)    Family History  Problem Relation Age of Onset   Mitral valve prolapse Mother    Breast cancer Sister 25   Colon cancer Maternal Grandmother    Cancer Maternal Grandmother        colon   Breast cancer Paternal Grandmother        dx in her 19s   Colon cancer Paternal Grandfather    Cancer Paternal Grandfather        thyroid cancer   Breast cancer Cousin 38       pat first cousin with breast cancer   Pancreatic cancer Cousin        pat first cousin dx in his 65s   Colon polyps Neg Hx     Social History   Socioeconomic History   Marital status: Married    Spouse name: Not on file   Number of children: Not on file   Years of education: Not on file   Highest education level: Not on file  Occupational History   Not on file  Tobacco Use   Smoking status: Never   Smokeless tobacco: Never  Vaping Use   Vaping Use: Never used  Substance and Sexual Activity   Alcohol use: Yes    Alcohol/week: 2.0 - 3.0 standard drinks    Types: 2 - 3 Glasses of wine per week    Comment: 2-3 a week   Drug use: No   Sexual activity: Yes  Other Topics Concern   Not on file  Social History Narrative   Not on file   Social Determinants of Health   Financial Resource Strain: Not on file  Food Insecurity: Not on file  Transportation Needs: Not on file  Physical Activity: Not on file  Stress: Not on file  Social Connections: Not on file  Intimate Partner Violence: Not on file    Physical Exam: Vital signs in last 24 hours: BP 126/75    Pulse 73    Temp 98.4 F (36.9 C)    Ht 5\' 7"  (1.702 m)    Wt 135 lb (61.2 kg)    SpO2 100%    BMI 21.14 kg/m  GEN: NAD EYE: Sclerae anicteric ENT: MMM CV: Non-tachycardic Pulm: No increased work of breathing GI: Soft, NT/ND NEURO:  Alert & Oriented   [name], MD [facility]  04/05/2021 1:29 PM

## 2021-04-05 NOTE — Progress Notes (Signed)
Called to room to assist during endoscopic procedure.  Patient ID and intended procedure confirmed with present staff. Received instructions for my participation in the procedure from the performing physician.  

## 2021-04-05 NOTE — Progress Notes (Signed)
C.W. vital signs. 

## 2021-04-05 NOTE — Op Note (Addendum)
[facility] Patient Name: Misty Strickland Procedure Date: 04/05/2021 1:28 PM MRN: [id number] Endoscopist: Sonny Masters "Misty Strickland ,  Age: 46 Referring MD:  Date of Birth: [birth year] Gender: Female Account #: [id number] Procedure:                Colonoscopy Indications:              Screening for colorectal malignant neoplasm Medicines:                Monitored Anesthesia Care Procedure:                Pre-Anesthesia Assessment:                           - Prior to the procedure, a History and Physical                            was performed, and patient medications and                            allergies were reviewed. The patient's tolerance of                            previous anesthesia was also reviewed. The risks                            and benefits of the procedure and the sedation                            options and risks were discussed with the patient.                            All questions were answered, and informed consent                            was obtained. Prior Anticoagulants: The patient has                            taken no previous anticoagulant or antiplatelet                            agents. ASA Grade Assessment: II - A patient with                            mild systemic disease. After reviewing the risks                            and benefits, the patient was deemed in                            satisfactory condition to undergo the procedure.                           After obtaining informed consent, the colonoscope  was passed under direct vision. Throughout the                            procedure, the patient's blood pressure, pulse, and                            oxygen saturations were monitored continuously. The                            Olympus #[id number] was introduced through the anus                            and advanced to the the cecum, identified by                            appendiceal  orifice and ileocecal valve. The                            colonoscopy was performed without difficulty. The                            patient tolerated the procedure well. The quality                            of the bowel preparation was excellent. The                            ileocecal valve, appendiceal orifice, and rectum                            were photographed. Scope In: 1:32:07 PM Scope Out: 1:58:32 PM Scope Withdrawal Time: 0 hours 17 minutes 13 seconds  Total Procedure Duration: 0 hours 26 minutes 25 seconds  Findings:                 A 6 mm polyp was found in the ascending colon. The                            polyp was sessile. The polyp was removed with a                            cold snare. Resection and retrieval were complete.                           Non-bleeding internal hemorrhoids were found during                            retroflexion. Complications:            No immediate complications. Estimated Blood Loss:     Estimated blood loss was minimal. Impression:               - The examined portion of the ileum was normal.                           -  One 6 mm polyp in the ascending colon, removed                            with a cold snare. Resected and retrieved.                           - Non-bleeding internal hemorrhoids. Recommendation:           - Discharge patient to home (with escort).                           - Await pathology results.                           - The findings and recommendations were discussed                            with the patient. Sonny Masters "Misty Strickland" Misty Strickland,  04/05/2021 2:02:10 PM

## 2021-04-05 NOTE — Patient Instructions (Signed)
Resume previous diet and medications. °Awaiting pathology results. Repeat Colonoscopy date to be determined based on pathology results. ° °YOU HAD AN ENDOSCOPIC PROCEDURE TODAY AT THE [facility]:   Refer to the procedure report that was given to you for any specific questions about what was found during the examination.  If the procedure report does not answer your questions, please call your gastroenterologist to clarify.  If you requested that your care partner not be given the details of your procedure findings, then the procedure report has been included in a sealed envelope for you to review at your convenience later. ° °YOU SHOULD EXPECT: Some feelings of bloating in the abdomen. Passage of more gas than usual.  Walking can help get rid of the air that was put into your GI tract during the procedure and reduce the bloating. If you had a lower endoscopy (such as a colonoscopy or flexible sigmoidoscopy) you may notice spotting of blood in your stool or on the toilet paper. If you underwent a bowel prep for your procedure, you may not have a normal bowel movement for a few days. ° °Please Note:  You might notice some irritation and congestion in your nose or some drainage.  This is from the oxygen used during your procedure.  There is no need for concern and it should clear up in a day or so. ° °SYMPTOMS TO REPORT IMMEDIATELY: ° °Following lower endoscopy (colonoscopy or flexible sigmoidoscopy): ° Excessive amounts of blood in the stool ° Significant tenderness or worsening of abdominal pains ° Swelling of the abdomen that is new, acute ° Fever of 100°F or higher ° °For urgent or emergent issues, a gastroenterologist can be reached at any hour by calling [phone]. °Do not use MyChart messaging for urgent concerns.  ° ° °DIET:  We do recommend a small meal at first, but then you may proceed to your regular diet.  Drink plenty of fluids but you should avoid alcoholic beverages for 24  hours. ° °ACTIVITY:  You should plan to take it easy for the rest of today and you should NOT DRIVE or use heavy machinery until tomorrow (because of the sedation medicines used during the test).   ° °FOLLOW UP: °Our staff will call the number listed on your records 48-72 hours following your procedure to check on you and address any questions or concerns that you may have regarding the information given to you following your procedure. If we do not reach you, we will leave a message.  We will attempt to reach you two times.  During this call, we will ask if you have developed any symptoms of COVID 19. If you develop any symptoms (ie: fever, flu-like symptoms, shortness of breath, cough etc.) before then, please call [phone].  If you test positive for Covid 19 in the 2 weeks post procedure, please call and report this information to us.   ° °If any biopsies were taken you will be contacted by phone or by letter within the next 1-3 weeks.  Please call us at [phone] if you have not heard about the biopsies in 3 weeks.  ° ° °SIGNATURES/CONFIDENTIALITY: °You and/or your care partner have signed paperwork which will be entered into your electronic medical record.  These signatures attest to the fact that that the information above on your After Visit Summary has been reviewed and is understood.  Full responsibility of the confidentiality of this discharge information lies with you and/or your care-partner.  °

## 2021-04-05 NOTE — Progress Notes (Signed)
Pt's states no medical or surgical changes since previsit or office visit. 

## 2021-04-05 NOTE — Progress Notes (Signed)
To pacu, VSS. Report to [email or web address] 

## 2021-04-09 ENCOUNTER — Telehealth: Payer: Self-pay

## 2021-04-09 NOTE — Telephone Encounter (Signed)
°  Follow up Call-  Call back number 04/05/2021  Post procedure Call Back phone  # [phone]  Permission to leave phone message Yes  Some recent data might be hidden     Patient questions:  Do you have a fever, pain , or abdominal swelling? No. Pain Score  0 *  Have you tolerated food without any problems? Yes.    Have you been able to return to your normal activities? Yes.    Do you have any questions about your discharge instructions: Diet   No. Medications  No. Follow up visit  No.  Do you have questions or concerns about your Care? No.  Actions: * If pain score is 4 or above: No action needed, pain <4.

## 2021-04-12 ENCOUNTER — Encounter: Payer: Self-pay | Admitting: Internal Medicine

## 2021-04-16 ENCOUNTER — Ambulatory Visit: Payer: BC Managed Care – PPO | Admitting: Nurse Practitioner

## 2021-05-09 ENCOUNTER — Ambulatory Visit: Payer: BC Managed Care – PPO | Admitting: Nurse Practitioner

## 2021-05-09 NOTE — Progress Notes (Deleted)
? ?  Misty Strickland 1975/03/27 [id number] ? ? ?History:  46 y.o. G3P0003 presents for annual exam. Monthly cycles. Normal pap and mammogram history. Saw genetics last year d/t family history of breast cancer.  ? ?Gynecologic History ?No LMP recorded. ?  ?Contraception/Family planning: rhythm method ?Sexually active: Yes ? ?Health Maintenance ?Last Pap: 04/12/2020. Results were: Normal, 5-year repeat ?Last mammogram: 05/14/2020. Results were: Left breast cyst ?Last colonoscopy: 04/05/2021. Results were: SSP ?Last Dexa: Not indicated ? ?Past medical history, past surgical history, family history and social history were all reviewed and documented in the EPIC chart. Sister diagnosed with breast cancer at age 26. Grandfather and grandmother with history of colon cancer.  ? ?ROS:  A ROS was performed and pertinent positives and negatives are included. ? ?Exam: ? ?There were no vitals filed for this visit. ? ?There is no height or weight on file to calculate BMI. ? ?General appearance:  Normal ?Thyroid:  Symmetrical, normal in size, without palpable masses or nodularity. ?Respiratory ? Auscultation:  Clear without wheezing or rhonchi ?Cardiovascular ? Auscultation:  Regular rate, without rubs, murmurs or gallops ? Edema/varicosities:  Not grossly evident ?Abdominal ? Soft,nontender, without masses, guarding or rebound. ? Liver/spleen:  No organomegaly noted ? Hernia:  None appreciated ? Skin ? Inspection:  Grossly normal ?  ?Breasts: Examined lying and sitting.  ? Right: Without masses, retractions, discharge or axillary adenopathy. ? ? Left: Firm, mobile, non-tender lump at 12 o'clock. No retractions, discharge or axillary adenopathy. ?Genitourinary  ? Inguinal/mons:  Normal without inguinal adenopathy ? External genitalia:  Normal appearing vulva with no masses, tenderness, or lesions ? BUS/Urethra/Skene's glands:  Normal ? Vagina:  Normal appearing with normal color and discharge, no lesions ? Cervix:  Normal appearing without  discharge or lesions ? Uterus:  Normal in size, shape and contour.  Midline and mobile, nontender ? Adnexa/parametria:   ?  Rt: Normal in size, without masses or tenderness. ?  Lt: Normal in size, without masses or tenderness. ? Anus and perineum: Normal ? Digital rectal exam: Normal sphincter tone without palpated masses or tenderness ? ?Patient informed chaperone available to be present for breast and pelvic exam. Patient has requested no chaperone to be present. Patient has been advised what will be completed during breast and pelvic exam.  ? ?Assessment/Plan:  46 y.o. G3P0003 for annual exam.  ? ?Well female exam with routine gynecological exam - Plan: CBC with Differential/Platelet, Comprehensive metabolic panel, Lipid panel. Education provided on SBEs, importance of preventative screenings, current guidelines, high calcium diet, regular exercise, and multivitamin daily. Plans to establish with a PCP in the area.  ? ? ? ?Screening for cervical cancer - Normal Pap history.  Pap with HR HPV today.  ? ?Follow up in 1 year for annual.  ? ? ? ? ? ?Tamela Gammon [facility], 10:29 AM 05/09/2021 ? ?

## 2021-05-14 ENCOUNTER — Ambulatory Visit (INDEPENDENT_AMBULATORY_CARE_PROVIDER_SITE_OTHER): Payer: BC Managed Care – PPO | Admitting: Nurse Practitioner

## 2021-05-14 ENCOUNTER — Telehealth: Payer: Self-pay

## 2021-05-14 ENCOUNTER — Encounter: Payer: Self-pay | Admitting: Nurse Practitioner

## 2021-05-14 VITALS — BP 120/78 | Ht 67.0 in | Wt 138.0 lb

## 2021-05-14 DIAGNOSIS — Z01419 Encounter for gynecological examination (general) (routine) without abnormal findings: Secondary | ICD-10-CM

## 2021-05-14 DIAGNOSIS — G43009 Migraine without aura, not intractable, without status migrainosus: Secondary | ICD-10-CM | POA: Diagnosis not present

## 2021-05-14 DIAGNOSIS — Z9189 Other specified personal risk factors, not elsewhere classified: Secondary | ICD-10-CM | POA: Diagnosis not present

## 2021-05-14 DIAGNOSIS — Z803 Family history of malignant neoplasm of breast: Secondary | ICD-10-CM

## 2021-05-14 MED ORDER — ELETRIPTAN HYDROBROMIDE 40 MG PO TABS: 40.0000 mg | ORAL_TABLET | ORAL | 5 refills | Status: DC | PRN

## 2021-05-14 NOTE — Telephone Encounter (Signed)
Tamela Gammon, NP  [facility] Triage ?This patient has a lifetime breast cancer risk of 25.80%. It is recommended she have annual breast MRI. She has her mammograms in April, so I would recommend having MRI planned for October/November.  ?

## 2021-05-14 NOTE — Progress Notes (Addendum)
? ?Misty Strickland [birth year] [id number] ? ? ?History:  46 y.o. G3P0003 presents for annual exam. Monthly cycles. Normal pap and mammogram history. Negative genetic testing done 05/2020 d/t significant family history of cancers. Migraines have become more frequent and were not being managed as well on Imitrex 50, so she was increased to 100 mg. This helped for a while but she feels this is not as effective anymore.  ? ?Gynecologic History ?Patient's last menstrual period was 05/08/2021. ?Period Cycle (Days): 28 ?Period Duration (Days): 6 ?Period Pattern: Regular ?Menstrual Flow: Moderate ?Dysmenorrhea: (!) Mild ?Dysmenorrhea Symptoms: Cramping ?Contraception/Family planning: rhythm method ?Sexually active: Yes ? ?Health Maintenance ?Last Pap: 04/12/2020. Results were: Normal, 5-year repeat ?Last mammogram: 05/14/2020. Results were: Left breast cyst ?Last colonoscopy: 04/05/2021. Results were: SSP ?Last Dexa: Not indicated ? ?Past medical history, past surgical history, family history and social history were all reviewed and documented in the EPIC chart. Married. SAHM. Kids ages 67, 35, and 31. From [place]. Sister diagnosed with breast cancer at age 20, PGM and paternal cousin with breast cancer history. Grandfather and grandmother with history of colon cancer, PGF with history of thyroid cancer.  ? ?ROS:  A ROS was performed and pertinent positives and negatives are included. ? ?Exam: ? ?Vitals:  ? 05/14/21 1003  ?BP: 120/78  ?Weight: 138 lb (62.6 kg)  ?Height: '5\' 7"'$  (1.702 m)  ? ? ?Body mass index is 21.61 kg/m?. ? ?General appearance:  Normal ?Thyroid:  Symmetrical, normal in size, without palpable masses or nodularity. ?Respiratory ? Auscultation:  Clear without wheezing or rhonchi ?Cardiovascular ? Auscultation:  Regular rate, without rubs, murmurs or gallops ? Edema/varicosities:  Not grossly evident ?Abdominal ? Soft,nontender, without masses, guarding or rebound. ? Liver/spleen:  No organomegaly noted ? Hernia:  None  appreciated ? Skin ? Inspection:  Grossly normal ?  ?Breasts: Examined lying and sitting.  ? Right: Without masses, retractions, discharge or axillary adenopathy. ? ? Left: Firm, mobile, non-tender lump at 12 o'clock. No retractions, discharge or axillary adenopathy. ?Genitourinary  ? Inguinal/mons:  Normal without inguinal adenopathy ? External genitalia:  Normal appearing vulva with no masses, tenderness, or lesions ? BUS/Urethra/Skene's glands:  Normal ? Vagina:  Normal appearing with normal color and discharge, no lesions ? Cervix:  Normal appearing without discharge or lesions ? Uterus:  Normal in size, shape and contour.  Midline and mobile, nontender ? Adnexa/parametria:   ?  Rt: Normal in size, without masses or tenderness. ?  Lt: Normal in size, without masses or tenderness. ? Anus and perineum: Normal ? Digital rectal exam: Normal sphincter tone without palpated masses or tenderness ? ?Patient informed chaperone available to be present for breast and pelvic exam. Patient has requested no chaperone to be present. Patient has been advised what will be completed during breast and pelvic exam.  ? ?Assessment/Plan:  46 y.o. G3P0003 for annual exam.  ? ?Well female exam with routine gynecological exam - Plan: CBC with Differential/Platelet, Comprehensive metabolic panel, Lipid panel. Education provided on SBEs, importance of preventative screenings, current guidelines, high calcium diet, regular exercise, and multivitamin daily.  ? ?Migraine without aura and without status migrainosus, not intractable - Plan: eletriptan (RELPAX) 40 MG tablet as needed. May repeat in 2 hours if headache persists or recurs. No more than 80 mg in 24 hours. Was on Imitrex for years but feels this is not working anymore. Will switch to different Triptan. If no improvement it is recommended she see PCP or neurology.  ? ?At high  risk for breast cancer - lifetime breast cancer risk of 25.80%. It is recommended she have annual breast MRI  and she is agreeable.  ? ?Screening for cervical cancer - Normal Pap history.  Will repeat at 5-year interval per guidelines. ? ?Screening for breast cancer - Normal mammogram history.  Continue annual screenings.  Normal breast exam today. ? ?Screening for colon cancer - 2023 colonoscopy. She is unsure of follow up. Likely 3-5 years based on SSP and family history of colon cancer. She will confirm.  ? ?Follow up in 1 year for annual.  ? ? ? ? ? ?Tamela Gammon [facility], 10:27 AM 05/14/2021 ? ?

## 2021-05-28 ENCOUNTER — Telehealth: Payer: Self-pay

## 2021-05-28 NOTE — Telephone Encounter (Signed)
My Chart message that Misty Sprinkles., NP had sent returned unread today. I called patient and per DPR access note on file I left detailed message in her voice mail with the message. Recall placed for October. ? ?"Misty Strickland, ?You have a lifetime risk of 25.80% for developing breast cancer, so it is recommended you have annual breast MRI as we discussed. Our office will set this up for you. It is likely too far out to get the MRI on the schedule but will be placed on a recall to do this in a few months. Reach out if you have any questions.  ?  ?Tiffany" ?

## 2021-07-02 ENCOUNTER — Encounter: Payer: Self-pay | Admitting: Nurse Practitioner

## 2021-07-03 ENCOUNTER — Other Ambulatory Visit: Payer: Self-pay | Admitting: Nurse Practitioner

## 2021-07-03 DIAGNOSIS — Z1231 Encounter for screening mammogram for malignant neoplasm of breast: Secondary | ICD-10-CM

## 2021-07-10 ENCOUNTER — Telehealth: Payer: Self-pay | Admitting: *Deleted

## 2021-07-10 NOTE — Telephone Encounter (Signed)
PA done on [employer] Prom[employer] PA for Relpax 40 mg tablet. Pending response from insurance.

## 2021-07-12 NOTE — Telephone Encounter (Signed)
Medication approved from 07/12/21-07/12/22

## 2021-07-17 ENCOUNTER — Ambulatory Visit
Admission: RE | Admit: 2021-07-17 | Discharge: 2021-07-17 | Disposition: A | Discharge status: Home / Self Care | Payer: BC Managed Care – PPO | Source: Ambulatory Visit | Attending: Nurse Practitioner | Admitting: Nurse Practitioner

## 2021-07-17 DIAGNOSIS — Z1231 Encounter for screening mammogram for malignant neoplasm of breast: Secondary | ICD-10-CM | POA: Diagnosis not present

## 2021-11-20 DIAGNOSIS — H16142 Punctate keratitis, left eye: Secondary | ICD-10-CM | POA: Diagnosis not present

## 2021-11-20 DIAGNOSIS — H16223 Keratoconjunctivitis sicca, not specified as Sjogren's, bilateral: Secondary | ICD-10-CM | POA: Diagnosis not present

## 2021-12-27 DIAGNOSIS — Z23 Encounter for immunization: Secondary | ICD-10-CM | POA: Diagnosis not present

## 2021-12-27 DIAGNOSIS — Z0000 Encounter for general adult medical examination without abnormal findings: Secondary | ICD-10-CM | POA: Diagnosis not present

## 2021-12-27 DIAGNOSIS — E538 Deficiency of other specified B group vitamins: Secondary | ICD-10-CM | POA: Diagnosis not present

## 2021-12-27 DIAGNOSIS — E559 Vitamin D deficiency, unspecified: Secondary | ICD-10-CM | POA: Diagnosis not present

## 2021-12-27 DIAGNOSIS — Z13220 Encounter for screening for lipoid disorders: Secondary | ICD-10-CM | POA: Diagnosis not present

## 2021-12-27 DIAGNOSIS — Z130 Encounter for screening for diseases of the blood and blood-forming organs and certain disorders involving the immune mechanism: Secondary | ICD-10-CM | POA: Diagnosis not present

## 2021-12-27 DIAGNOSIS — Z79899 Other long term (current) drug therapy: Secondary | ICD-10-CM | POA: Diagnosis not present

## 2022-01-07 DIAGNOSIS — Z0189 Encounter for other specified special examinations: Secondary | ICD-10-CM | POA: Diagnosis not present

## 2022-01-10 NOTE — Telephone Encounter (Signed)
Order for MRI placed and pt notified to call [facility] and schedule at her earliest convenience.

## 2022-01-31 NOTE — Telephone Encounter (Signed)
Per MRI appt notes: "01/15/22 2nd attempt [facility] EPIC [facility]  01-11-2022 1ST ATTEMPT EPIC ORDER LMOM/DP"  Left detailed VM on machine per DPR to call us back and let us know her plans regarding her supplemental breast cancer screening.

## 2022-02-12 NOTE — Telephone Encounter (Signed)
Multiple attempts by [place] and [facility] to contact pt with no success and nothing scheduled.  Ok to close and wait for pt to reach out if desires?  Please advise.

## 2022-02-12 NOTE — Telephone Encounter (Signed)
Order cancelled, encounter closed.

## 2022-02-12 NOTE — Telephone Encounter (Signed)
Yes, OK to close. Thanks.

## 2022-02-18 DIAGNOSIS — D2271 Melanocytic nevi of right lower limb, including hip: Secondary | ICD-10-CM | POA: Diagnosis not present

## 2022-02-18 DIAGNOSIS — L578 Other skin changes due to chronic exposure to nonionizing radiation: Secondary | ICD-10-CM | POA: Diagnosis not present

## 2022-02-18 DIAGNOSIS — D2230 Melanocytic nevi of unspecified part of face: Secondary | ICD-10-CM | POA: Diagnosis not present

## 2022-02-18 DIAGNOSIS — D2371 Other benign neoplasm of skin of right lower limb, including hip: Secondary | ICD-10-CM | POA: Diagnosis not present

## 2022-04-29 IMAGING — US US BREAST*L* LIMITED INC AXILLA
1 series · 12 of 12 positions shown · non-contrast
Comparison: 03/31/2019

CLINICAL DATA: Palpable abnormality in the LEFT breast. Patient has
a strong family history of breast cancer. Her sister was diagnosed
with breast cancer 32 (genetic testing negative), paternal cousin
(genetic testing negative) and paternal grandmother were also
diagnosed with breast cancer. Patient has recently undergone genetic
testing herself but does not know the results.

EXAM:
DIGITAL DIAGNOSTIC BILATERAL MAMMOGRAM WITH TOMOSYNTHESIS AND CAD;
ULTRASOUND LEFT BREAST LIMITED
TECHNIQUE: Bilateral digital diagnostic mammography and breast tomosynthesis
was performed. The images were evaluated with computer-aided
detection.; Targeted ultrasound examination of the left breast was
performed

[Series 1: us breast*left* limited inc axilla · 0.06mm/px · 12 of 12 slices shown]
[im 1/12]
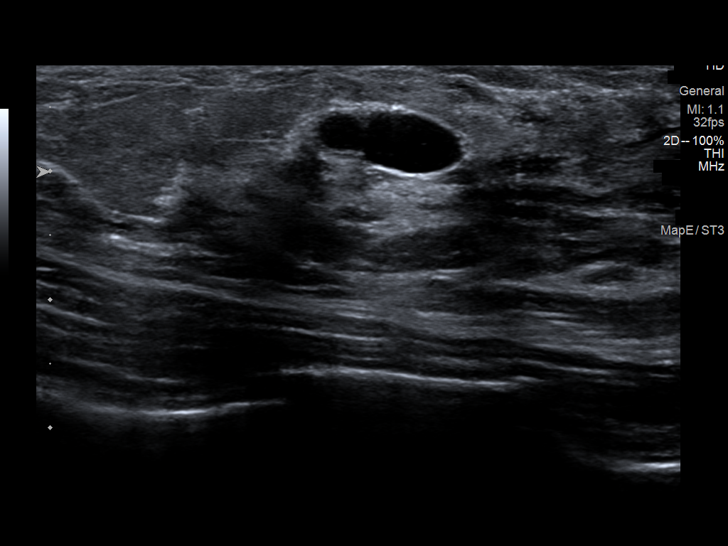
[im 2/12]
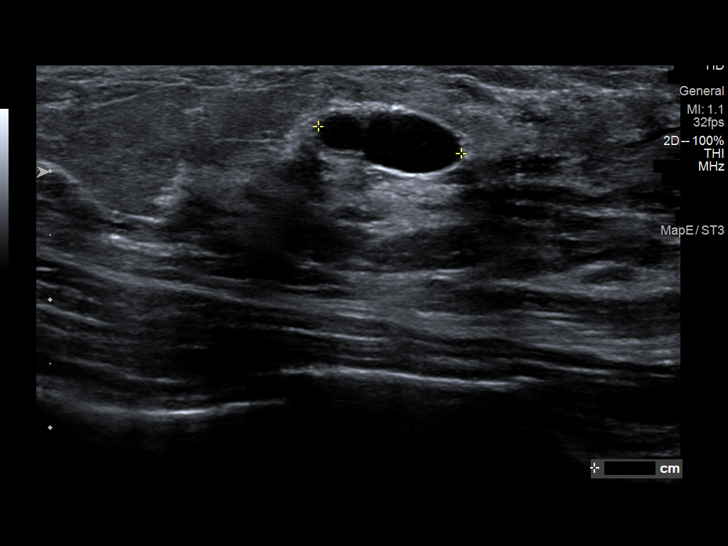
[im 3/12]
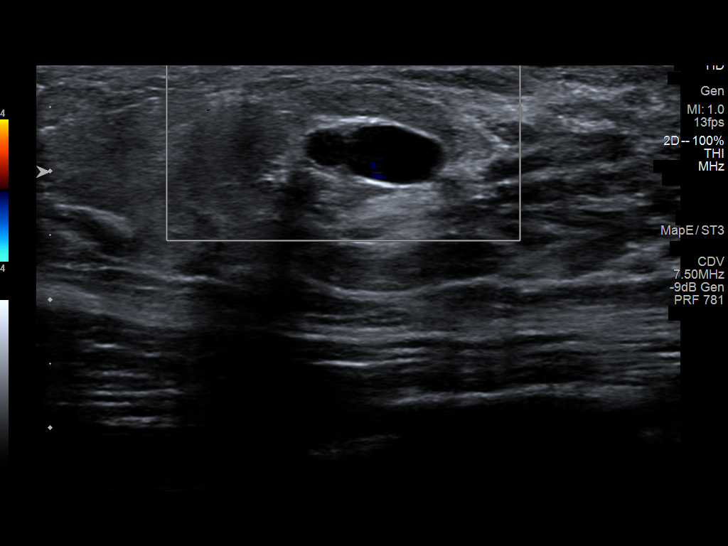
[im 4/12]
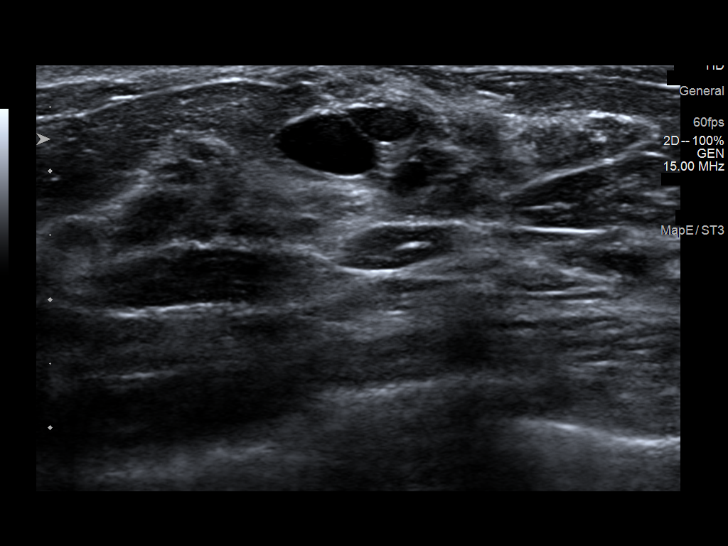
[im 5/12]
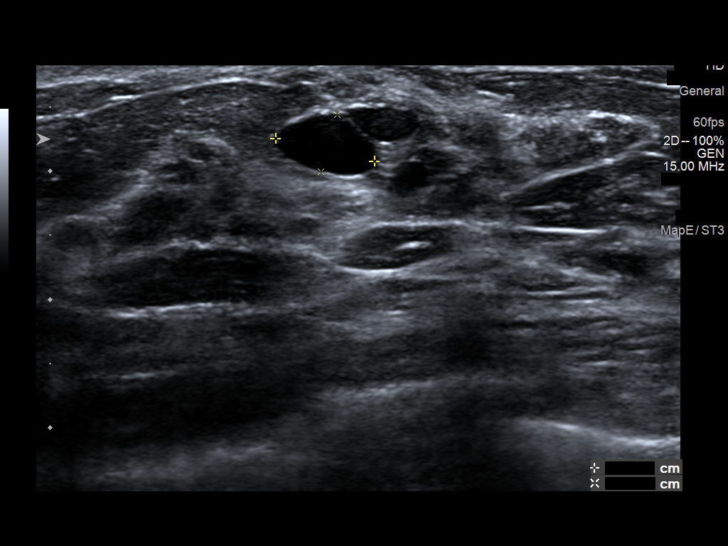
[im 6/12]
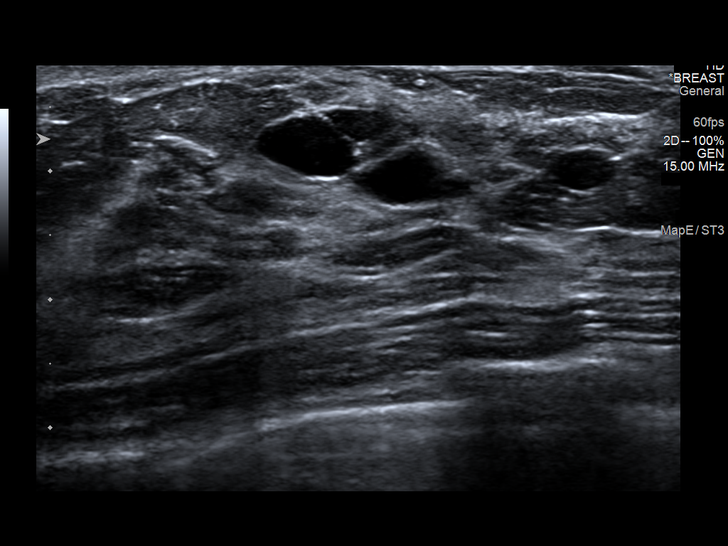
[im 7/12]
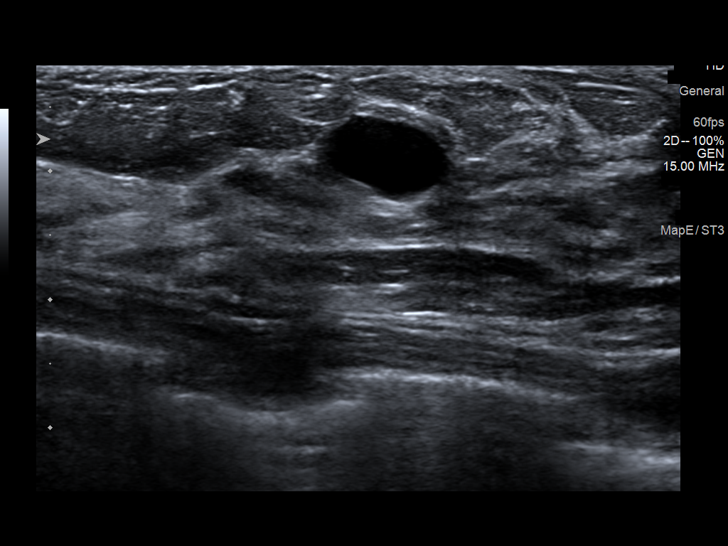
[im 8/12]
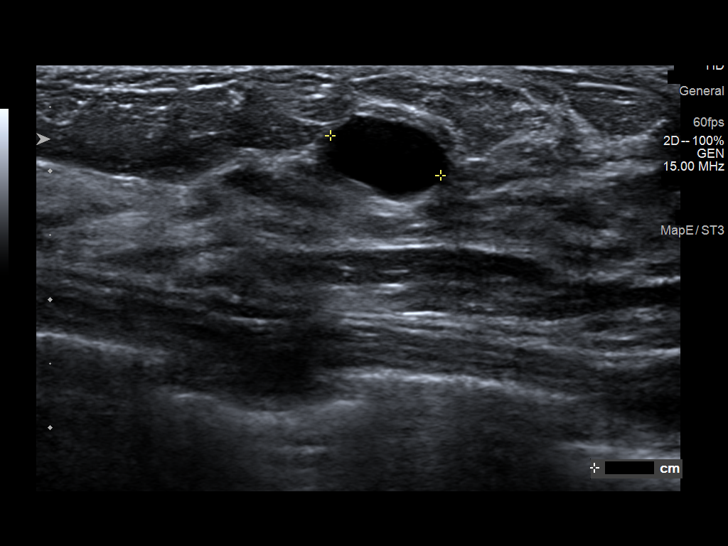
[im 9/12]
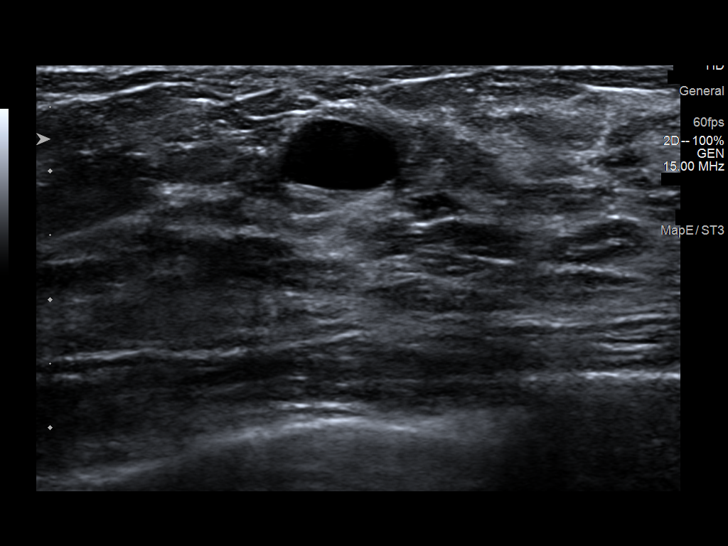
[im 10/12]
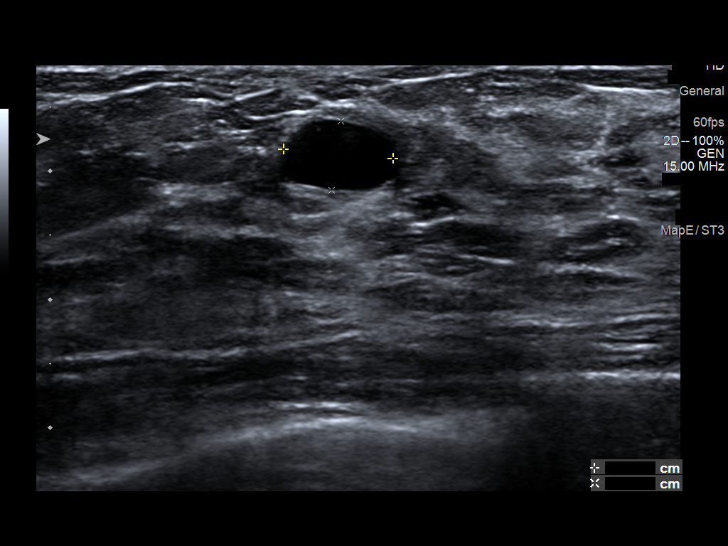
[im 11/12]
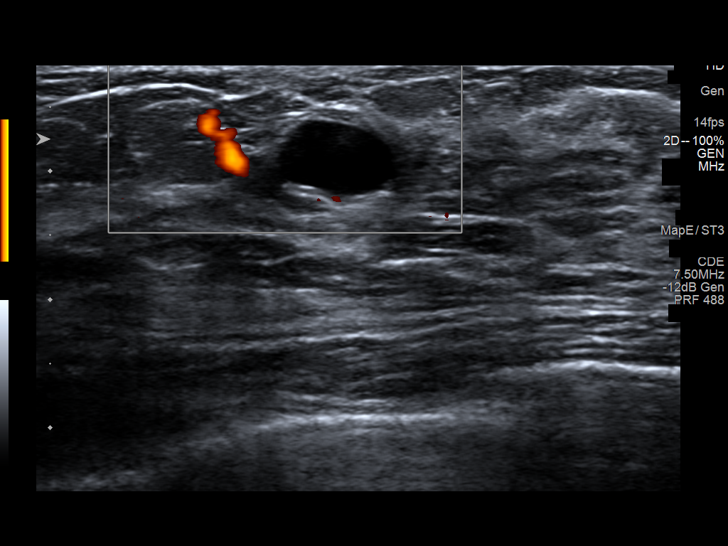
[im 12/12]
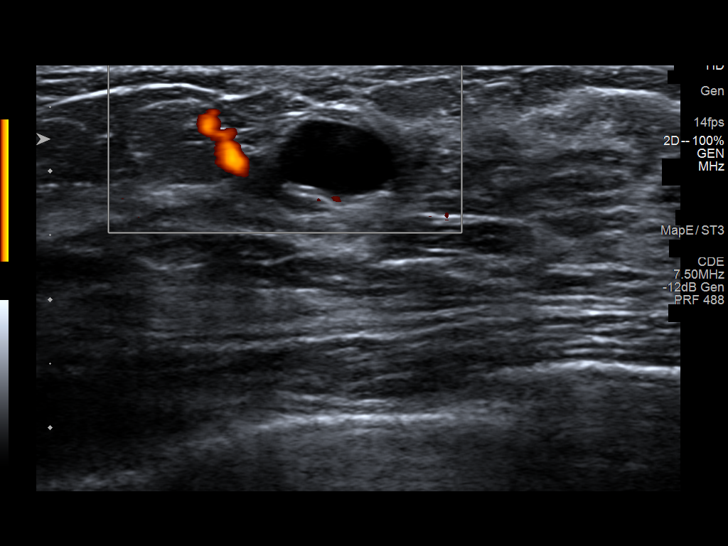

[12 of 12 positions shown; findings below may reference images not displayed]

ACR Breast Density Category c: The breast tissue is heterogeneously
dense, which may obscure small masses.
FINDINGS: RIGHT breast is negative.

A partially obscured oval mass is not identified beneath the BB in
the 12 o'clock location of the LEFT breast. No distortion or
suspicious microcalcifications.

On physical exam, I palpate rounded mobile mass in the 12 o'clock
location of the LEFT breast corresponding to the area of concern.

Targeted ultrasound is performed, showing a simple cyst in the 12
o'clock location of the LEFT breast 2 centimeters from the nipple
measuring 1.1 x 0.8 x 0.5 centimeters. In the 11 o'clock location 3
centimeters from the nipple, a simple cyst is 0.9 x 0.9 x
centimeters. No solid masses or areas of acoustic shadowing.
IMPRESSION: LEFT breast cyst.

Strong family history of breast cancer.

RECOMMENDATION:
Screening mammogram in one year.(Code:5A-O-YUP)

Based on the recommendations of the American Cancer Society, annual
screening MRI is suggested in addition to annual mammography if the
patient has an estimated lifetime risk of developing breast cancer
which is greater than 20%.

I have discussed the findings and recommendations with the patient.
If applicable, a reminder letter will be sent to the patient
regarding the next appointment.

BI-RADS CATEGORY  2: Benign.

## 2022-05-19 ENCOUNTER — Encounter: Payer: Self-pay | Admitting: Nurse Practitioner

## 2022-05-19 ENCOUNTER — Ambulatory Visit (INDEPENDENT_AMBULATORY_CARE_PROVIDER_SITE_OTHER): Payer: BC Managed Care – PPO | Admitting: Nurse Practitioner

## 2022-05-19 VITALS — BP 102/64 | HR 78 | Ht 66.54 in | Wt 137.0 lb

## 2022-05-19 DIAGNOSIS — Z9189 Other specified personal risk factors, not elsewhere classified: Secondary | ICD-10-CM | POA: Diagnosis not present

## 2022-05-19 DIAGNOSIS — Z01419 Encounter for gynecological examination (general) (routine) without abnormal findings: Secondary | ICD-10-CM | POA: Diagnosis not present

## 2022-05-19 NOTE — Progress Notes (Signed)
Misty Strickland Dec 01, 1975 [id number]   History:  47 y.o. G3P0003 presents for annual exam. Monthly cycles. Normal pap and mammogram history. Negative genetic testing done 05/2020 d/t significant family history of cancers. Lifetime breast cancer risk of 25%, annual MRIs recommended. Migraines managed by PCP.    Gynecologic History Patient's last menstrual period was 05/08/2022. Period Cycle (Days): 28 Period Duration (Days): 7 Period Pattern: Regular Menstrual Flow: Moderate Menstrual Control:  (cup) Dysmenorrhea: (!) Mild Dysmenorrhea Symptoms: Cramping Contraception/Family planning: rhythm method Sexually active: Yes  Health Maintenance Last Pap: 04/12/2020. Results were: Normal neg HPV, 5-year repeat Last mammogram: 07/17/2021. Results were: Normal Last colonoscopy: 04/05/2021. Results were: SSP, 3-year recall Last Dexa: Not indicated  Past medical history, past surgical history, family history and social history were all reviewed and documented in the EPIC chart. Married. SAHM. 22 and 40 yo daughter, both dancers. 38 yo son. From [place]. Sister diagnosed with breast cancer at age 43, PGM and paternal cousin with breast cancer history. Grandfather and grandmother with history of colon cancer, PGF with history of thyroid cancer.   ROS:  A ROS was performed and pertinent positives and negatives are included.  Exam:  Vitals:   05/19/22 1042  BP: 102/64  Pulse: 78  SpO2: 100%  Weight: 137 lb (62.1 kg)  Height: 5' 6.54" (1.69 m)     Body mass index is 21.76 kg/m.  General appearance:  Normal Thyroid:  Symmetrical, normal in size, without palpable masses or nodularity. Respiratory  Auscultation:  Clear without wheezing or rhonchi Cardiovascular  Auscultation:  Regular rate, without rubs, murmurs or gallops  Edema/varicosities:  Not grossly evident Abdominal  Soft,nontender, without masses, guarding or rebound.  Liver/spleen:  No organomegaly noted  Hernia:  None  appreciated  Skin  Inspection:  Grossly normal Breasts: Examined lying and sitting.   Right: Without masses, retractions, discharge or axillary adenopathy.   Left:  Without masses, retractions, discharge or axillary adenopathy. Genitourinary   Inguinal/mons:  Normal without inguinal adenopathy  External genitalia:  Normal appearing vulva with no masses, tenderness, or lesions  BUS/Urethra/Skene's glands:  Normal  Vagina:  Normal appearing with normal color and discharge, no lesions  Cervix:  Normal appearing without discharge or lesions  Uterus:  Normal in size, shape and contour.  Midline and mobile, nontender  Adnexa/parametria:     Rt: Normal in size, without masses or tenderness.   Lt: Normal in size, without masses or tenderness.  Anus and perineum: Normal  Digital rectal exam: Deferred  Patient informed chaperone available to be present for breast and pelvic exam. Patient has requested no chaperone to be present. Patient has been advised what will be completed during breast and pelvic exam.   Assessment/Plan:  47 y.o. G3P0003 for annual exam.   Well female exam with routine gynecological exam - Education provided on SBEs, importance of preventative screenings, current guidelines, high calcium diet, regular exercise, and multivitamin daily. Labs with PCP.   At high risk for breast cancer - lifetime breast cancer risk of 25.80%. It is recommended she have annual breast MRI and she is agreeable.   Screening for cervical cancer - Normal Pap history.  Will repeat at 5-year interval per guidelines.  Screening for breast cancer - Normal mammogram history.  Continue annual screenings.  Normal breast exam today.  Screening for colon cancer - 2023 colonoscopy. Will follow up at GI's recommended interval.   Follow up in 1 year for annual.       Jaquavius Hudler A  Earlene Plater [facility], 11:11 AM 05/19/2022

## 2022-05-20 ENCOUNTER — Telehealth: Payer: Self-pay

## 2022-05-20 DIAGNOSIS — Z9189 Other specified personal risk factors, not elsewhere classified: Secondary | ICD-10-CM

## 2022-05-20 DIAGNOSIS — Z803 Family history of malignant neoplasm of breast: Secondary | ICD-10-CM

## 2022-05-20 NOTE — Telephone Encounter (Signed)
[facility]'s first attempt to contact pt today per order notes. LMOM for pt to cb.

## 2022-05-20 NOTE — Telephone Encounter (Signed)
MRI order placed. Will keep an eye out for when pt gets scheduled by [facility] to send confirmation to PA coordinator. 

## 2022-05-20 NOTE — Telephone Encounter (Signed)
-----   Message from Olivia Mackie, NP sent at 05/19/2022 11:10 AM EDT ----- Regarding: Breast MRI Annual breast MRI recommended and she is agreeable. Lifetime breast cancer risk of 25.8%.

## 2022-05-28 NOTE — Telephone Encounter (Signed)
Breast MRI scheduled for 06/10/2022. Will send msg to PA coordinator to start process.

## 2022-06-10 ENCOUNTER — Ambulatory Visit
Admission: RE | Admit: 2022-06-10 | Discharge: 2022-06-10 | Disposition: A | Discharge status: Home / Self Care | Payer: BC Managed Care – PPO | Source: Ambulatory Visit | Attending: Nurse Practitioner | Admitting: Nurse Practitioner

## 2022-06-10 DIAGNOSIS — Z9189 Other specified personal risk factors, not elsewhere classified: Secondary | ICD-10-CM

## 2022-06-10 DIAGNOSIS — Z803 Family history of malignant neoplasm of breast: Secondary | ICD-10-CM

## 2022-06-10 DIAGNOSIS — Z1231 Encounter for screening mammogram for malignant neoplasm of breast: Secondary | ICD-10-CM | POA: Diagnosis not present

## 2022-06-10 MED ORDER — GADOPICLENOL 0.5 MMOL/ML IV SOLN
6.0000 mL | Freq: Once | INTRAVENOUS | Status: AC | PRN
  Administered 2022-06-10: 6 mL via INTRAVENOUS

## 2022-06-17 ENCOUNTER — Encounter: Payer: Self-pay | Admitting: Nurse Practitioner

## 2022-06-18 NOTE — Telephone Encounter (Signed)
Received. Thank you.

## 2022-06-18 NOTE — Telephone Encounter (Signed)
MRI results received from 06/10/2022. Will route to provider for final review and close encounter.

## 2022-07-14 NOTE — Telephone Encounter (Signed)
Mychart msg returned unread. LDVM on machine per DPR.

## 2022-08-09 DIAGNOSIS — H6991 Unspecified Eustachian tube disorder, right ear: Secondary | ICD-10-CM | POA: Diagnosis not present

## 2022-08-28 ENCOUNTER — Other Ambulatory Visit: Payer: Self-pay | Admitting: Nurse Practitioner

## 2022-08-28 DIAGNOSIS — Z1231 Encounter for screening mammogram for malignant neoplasm of breast: Secondary | ICD-10-CM

## 2022-08-29 ENCOUNTER — Ambulatory Visit
Admission: RE | Admit: 2022-08-29 | Discharge: 2022-08-29 | Disposition: A | Discharge status: Home / Self Care | Payer: BC Managed Care – PPO | Source: Ambulatory Visit | Attending: Nurse Practitioner | Admitting: Nurse Practitioner

## 2022-08-29 DIAGNOSIS — Z1231 Encounter for screening mammogram for malignant neoplasm of breast: Secondary | ICD-10-CM | POA: Diagnosis not present

## 2022-09-03 ENCOUNTER — Other Ambulatory Visit: Payer: Self-pay | Admitting: Nurse Practitioner

## 2022-09-03 DIAGNOSIS — R928 Other abnormal and inconclusive findings on diagnostic imaging of breast: Secondary | ICD-10-CM

## 2022-09-05 ENCOUNTER — Ambulatory Visit
Admission: RE | Admit: 2022-09-05 | Discharge: 2022-09-05 | Disposition: A | Discharge status: Home / Self Care | Payer: BC Managed Care – PPO | Source: Ambulatory Visit | Attending: Nurse Practitioner | Admitting: Nurse Practitioner

## 2022-09-05 DIAGNOSIS — R928 Other abnormal and inconclusive findings on diagnostic imaging of breast: Secondary | ICD-10-CM

## 2022-09-05 DIAGNOSIS — N6012 Diffuse cystic mastopathy of left breast: Secondary | ICD-10-CM | POA: Diagnosis not present

## 2022-11-06 ENCOUNTER — Encounter: Payer: Self-pay | Admitting: Nurse Practitioner

## 2022-11-06 DIAGNOSIS — Z803 Family history of malignant neoplasm of breast: Secondary | ICD-10-CM

## 2022-11-06 DIAGNOSIS — R928 Other abnormal and inconclusive findings on diagnostic imaging of breast: Secondary | ICD-10-CM

## 2022-11-06 DIAGNOSIS — Z9189 Other specified personal risk factors, not elsewhere classified: Secondary | ICD-10-CM

## 2022-11-07 NOTE — Telephone Encounter (Signed)
Per review of 06/10/22 MRI, f/u in 6 months for bilateral breast MRI for non-mass enhancement in bilateral breast. Order placed.  Routing to [employer].   Encounter closed.

## 2022-12-02 NOTE — Telephone Encounter (Signed)
MRI scheduled for 12/24/2022. Msg sent to PA coordinator to initiate PA for imaging.

## 2022-12-10 ENCOUNTER — Encounter: Payer: Self-pay | Admitting: Nurse Practitioner

## 2022-12-24 ENCOUNTER — Other Ambulatory Visit: Payer: BC Managed Care – PPO

## 2023-02-07 ENCOUNTER — Ambulatory Visit
Admission: RE | Admit: 2023-02-07 | Discharge: 2023-02-07 | Disposition: A | Discharge status: Home / Self Care | Payer: BC Managed Care – PPO | Source: Ambulatory Visit | Attending: Nurse Practitioner | Admitting: Nurse Practitioner

## 2023-02-07 DIAGNOSIS — R928 Other abnormal and inconclusive findings on diagnostic imaging of breast: Secondary | ICD-10-CM

## 2023-02-07 DIAGNOSIS — Z803 Family history of malignant neoplasm of breast: Secondary | ICD-10-CM

## 2023-02-07 DIAGNOSIS — Z9189 Other specified personal risk factors, not elsewhere classified: Secondary | ICD-10-CM

## 2023-02-07 MED ORDER — GADOPICLENOL 0.5 MMOL/ML IV SOLN
6.0000 mL | Freq: Once | INTRAVENOUS | Status: AC | PRN
  Administered 2023-02-07: 6 mL via INTRAVENOUS

## 2023-07-16 ENCOUNTER — Other Ambulatory Visit: Payer: Self-pay | Admitting: Nurse Practitioner

## 2023-07-16 DIAGNOSIS — Z1231 Encounter for screening mammogram for malignant neoplasm of breast: Secondary | ICD-10-CM

## 2023-08-31 ENCOUNTER — Ambulatory Visit

## 2023-09-08 ENCOUNTER — Ambulatory Visit
Admission: RE | Admit: 2023-09-08 | Discharge: 2023-09-08 | Disposition: A | Discharge status: Home / Self Care | Source: Ambulatory Visit | Attending: Nurse Practitioner

## 2023-09-08 DIAGNOSIS — Z1231 Encounter for screening mammogram for malignant neoplasm of breast: Secondary | ICD-10-CM

## 2024-01-13 ENCOUNTER — Other Ambulatory Visit (HOSPITAL_BASED_OUTPATIENT_CLINIC_OR_DEPARTMENT_OTHER): Payer: Self-pay | Admitting: Family Medicine

## 2024-01-13 DIAGNOSIS — E785 Hyperlipidemia, unspecified: Secondary | ICD-10-CM

## 2024-01-29 ENCOUNTER — Other Ambulatory Visit (HOSPITAL_BASED_OUTPATIENT_CLINIC_OR_DEPARTMENT_OTHER)

## 2024-01-29 ENCOUNTER — Encounter (HOSPITAL_BASED_OUTPATIENT_CLINIC_OR_DEPARTMENT_OTHER): Payer: Self-pay

## 2024-02-01 ENCOUNTER — Telehealth: Payer: Self-pay | Admitting: *Deleted

## 2024-02-01 NOTE — Telephone Encounter (Signed)
 Spoke with patient. Reports right breast lump, quarter size and tender. Noticed this weekend. Requesting to schedule screening Breast MRI.  Denies nipple discharge, itching, skin changes, fever/chills.   Last AEX 05/19/22 -TW Last MMG screening 09/08/23, BiRads 1 neg MRI 02/07/23  Advised OV recommended for further evaluation, patient agreeable to schedule with covering provider. OV scheduled for 02/02/24 at 0930 with Dr. Nikki. Advised overdue for AEX, patient will schedule after OV.   Routing to provider for final review. Patient is agreeable to disposition. Will close encounter.

## 2024-02-02 ENCOUNTER — Telehealth: Payer: Self-pay | Admitting: Obstetrics and Gynecology

## 2024-02-02 ENCOUNTER — Ambulatory Visit: Admitting: Obstetrics and Gynecology

## 2024-02-02 ENCOUNTER — Encounter: Payer: Self-pay | Admitting: Obstetrics and Gynecology

## 2024-02-02 VITALS — BP 104/62 | HR 76

## 2024-02-02 DIAGNOSIS — N6341 Unspecified lump in right breast, subareolar: Secondary | ICD-10-CM

## 2024-02-02 NOTE — Progress Notes (Signed)
 "  GYNECOLOGY  VISIT   HPI: 48 y.o.   Married  Caucasian female   [id number] with Patient's last menstrual period was 01/13/2024 (exact date).   here for: R Breast Lump and some tenderness. First noticed 01/30/24     Occurred suddenly.  Hurt to raise her arm.    The size has decreased and feels much less painful.  No trauma.     No fever or flu like symptoms.   Does yearly mammogram and breast MRI.    Patient had negative genetic testing in 2022.    Not doing any HRT.   Due for a breast MRI.    Sister had breast cancer in her 30s.   She is doing well.    GYNECOLOGIC HISTORY: Patient's last menstrual period was 01/13/2024 (exact date). Contraception:  None  Menopausal hormone therapy:  n/a Last 2 paps:  04/12/20 neg HR HPV neg  History of abnormal Pap or positive HPV:  No Mammogram:  09/08/23 Breast Density Cat C, BIRADS Cat 1 neg         OB History     Gravida  3   Para      Term      Preterm      AB  0   Living  3      SAB  0   IAB      Ectopic      Multiple      Live Births                 Patient Active Problem List   Diagnosis Date Noted   Genetic testing 05/14/2020   Family history of breast cancer    Family history of pancreatic cancer    Chronic cholecystitis 04/26/2013    Past Medical History:  Diagnosis Date   Anemia    during pregnancy   Family history of breast cancer    Family history of pancreatic cancer    Headache(784.0)    during menstrual periods    Past Surgical History:  Procedure Laterality Date   CESAREAN SECTION     2007,2009,2013   CHOLECYSTECTOMY N/A 05/04/2013   Procedure: LAPAROSCOPIC CHOLECYSTECTOMY WITH INTRAOPERATIVE CHOLANGIOGRAM;  Surgeon: Donnice POUR. Belinda, MD;  Location: [facility] OR;  Service: General;  Laterality: N/A;    Current Outpatient Medications  Medication Sig Dispense Refill   Multiple Vitamin (MULTIVITAMIN) LIQD Take 5 mLs by mouth daily.     SUMAtriptan (IMITREX) 100 MG tablet 100 mg.      tretinoin (RETIN-A) 0.025 % cream APPLY TOPICALLY TO FACE EVERY DAY IN THE EVENING; Duration: 84 days     No current facility-administered medications for this visit.     ALLERGIES: Patient has no known allergies.  Family History  Problem Relation Age of Onset   Mitral valve prolapse Mother    Breast cancer Sister 55   Colon cancer Maternal Grandmother    Cancer Maternal Grandmother        colon   Breast cancer Paternal Grandmother        dx in her 54s   Colon cancer Paternal Grandfather    Cancer Paternal Grandfather        thyroid cancer   Breast cancer Cousin 38       pat first cousin with breast cancer   Pancreatic cancer Cousin        pat first cousin dx in his 50s   Colon polyps Neg Hx     Social History  Socioeconomic History   Marital status: Married    Spouse name: Not on file   Number of children: Not on file   Years of education: Not on file   Highest education level: Not on file  Occupational History   Not on file  Tobacco Use   Smoking status: Never   Smokeless tobacco: Never  Vaping Use   Vaping status: Never Used  Substance and Sexual Activity   Alcohol use: Yes    Alcohol/week: 2.0 - 3.0 standard drinks of alcohol    Types: 2 - 3 Glasses of wine per week    Comment: 2-3 a week   Drug use: No   Sexual activity: Yes    Partners: Male    Birth control/protection: None  Other Topics Concern   Not on file  Social History Narrative   Not on file   Social Drivers of Health   Tobacco Use: Low Risk (02/02/2024)   Patient History    Smoking Tobacco Use: Never    Smokeless Tobacco Use: Never    Passive Exposure: Not on file  Financial Resource Strain: Not on file  Food Insecurity: Not on file  Transportation Needs: Not on file  Physical Activity: Not on file  Stress: Not on file  Social Connections: Not on file  Intimate Partner Violence: Not on file  Depression (EYV7-0): Not on file  Alcohol Screen: Not on file  Housing: Not on file   Utilities: Not on file  Health Literacy: Not on file    Review of Systems  All other systems reviewed and are negative.   PHYSICAL EXAMINATION:   BP 104/62 (BP Location: Left Arm, Patient Position: Sitting)   Pulse 76   LMP 01/13/2024 (Exact Date)   SpO2 99%     General appearance: alert, cooperative and appears stated age Head: Normocephalic, without obvious abnormality, atraumatic Neck: no adenopathy Breasts:  Right - normal appearance, 4 cm smooth and mobile retroareolar mass, no tenderness, No nipple retraction or dimpling, No nipple discharge or bleeding, No axillary or supraclavicular adenopathy Left - normal appearance, no masses or tenderness, No nipple retraction or dimpling, No nipple discharge or bleeding, No axillary or supraclavicular adenopathy  Chaperone was present for exam:  Kari HERO, CMA  ASSESSMENT:  Right breast mass.  Subareolar. No sign of mastitis.  Personal negative genetic testing.   FH breast cancer.   PLAN:  Breast mass discussed.  Will order dx right mammogram and right breast ultrasound at [facility].  Will hold off on ordering breast MRI until mammogram and US  are done and radiologist makes recommendations.  Ibuprofen, heat or ice pack to treat discomfort.   21 min  total time was spent for this patient encounter, including preparation, face-to-face counseling with the patient, coordination of care, and documentation of the encounter.    "

## 2024-02-02 NOTE — Telephone Encounter (Signed)
 Orders placed. Appt is 03-04-24.

## 2024-02-02 NOTE — Telephone Encounter (Signed)
 Please schedule a dx right mammogram and right breast US  at [facility].   Patient has a 4 cm retroareolar mass of sudden onset.   FH breast cancer.   Flexible schedule.

## 2024-02-17 ENCOUNTER — Ambulatory Visit
Admission: RE | Admit: 2024-02-17 | Discharge: 2024-02-17 | Disposition: A | Discharge status: Home / Self Care | Source: Ambulatory Visit | Attending: Obstetrics and Gynecology | Admitting: Obstetrics and Gynecology

## 2024-02-17 DIAGNOSIS — N6341 Unspecified lump in right breast, subareolar: Secondary | ICD-10-CM

## 2024-02-19 ENCOUNTER — Ambulatory Visit: Payer: Self-pay | Admitting: Obstetrics and Gynecology

## 2024-02-19 ENCOUNTER — Other Ambulatory Visit: Payer: Self-pay

## 2024-02-19 DIAGNOSIS — Z9189 Other specified personal risk factors, not elsewhere classified: Secondary | ICD-10-CM

## 2024-02-19 DIAGNOSIS — Z803 Family history of malignant neoplasm of breast: Secondary | ICD-10-CM

## 2024-02-19 NOTE — Telephone Encounter (Signed)
 Orders placed for Breast MRI.

## 2024-02-23 NOTE — Addendum Note (Signed)
 Addended by: BRUTUS KATE SAILOR on: 02/23/2024 02:53 PM   Modules accepted: Orders

## 2024-03-04 ENCOUNTER — Encounter

## 2024-03-04 ENCOUNTER — Other Ambulatory Visit

## 2024-03-23 ENCOUNTER — Other Ambulatory Visit: Payer: Self-pay

## 2024-03-25 ENCOUNTER — Ambulatory Visit: Admitting: Neurology
